# Patient Record
Sex: Male | Born: 1955 | Race: White | Hispanic: No | Marital: Married | Smoking: Never smoker
Health system: Southern US, Community
[De-identification: ages and names within clinical notes are randomized; demographics above are authoritative.]

## PROBLEM LIST (undated history)

## (undated) DIAGNOSIS — M1990 Unspecified osteoarthritis, unspecified site: Secondary | ICD-10-CM

## (undated) DIAGNOSIS — F32A Depression, unspecified: Secondary | ICD-10-CM

## (undated) DIAGNOSIS — G4730 Sleep apnea, unspecified: Secondary | ICD-10-CM

## (undated) DIAGNOSIS — F419 Anxiety disorder, unspecified: Secondary | ICD-10-CM

## (undated) DIAGNOSIS — I10 Essential (primary) hypertension: Secondary | ICD-10-CM

## (undated) DIAGNOSIS — K219 Gastro-esophageal reflux disease without esophagitis: Secondary | ICD-10-CM

## (undated) DIAGNOSIS — E785 Hyperlipidemia, unspecified: Secondary | ICD-10-CM

## (undated) DIAGNOSIS — F329 Major depressive disorder, single episode, unspecified: Secondary | ICD-10-CM

## (undated) DIAGNOSIS — T7840XA Allergy, unspecified, initial encounter: Secondary | ICD-10-CM

## (undated) HISTORY — DX: Hyperlipidemia, unspecified: E78.5

## (undated) HISTORY — PX: ROTATOR CUFF REPAIR: SHX139

## (undated) HISTORY — PX: OTHER SURGICAL HISTORY: SHX169

## (undated) HISTORY — DX: Sleep apnea, unspecified: G47.30

## (undated) HISTORY — DX: Anxiety disorder, unspecified: F41.9

## (undated) HISTORY — DX: Essential (primary) hypertension: I10

## (undated) HISTORY — DX: Allergy, unspecified, initial encounter: T78.40XA

## (undated) HISTORY — DX: Unspecified osteoarthritis, unspecified site: M19.90

## (undated) HISTORY — DX: Gastro-esophageal reflux disease without esophagitis: K21.9

---
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
## 2015-11-15 HISTORY — PX: BICEPS TENDON REPAIR: SHX566

## 2017-09-03 ENCOUNTER — Encounter: Payer: Self-pay | Admitting: Family Medicine

## 2017-09-03 ENCOUNTER — Ambulatory Visit: Payer: 59 | Admitting: Family Medicine

## 2017-09-03 VITALS — BP 124/83 | HR 68 | Temp 98.3°F | Resp 12 | Ht 72.0 in | Wt 168.1 lb

## 2017-09-03 DIAGNOSIS — E785 Hyperlipidemia, unspecified: Secondary | ICD-10-CM | POA: Diagnosis not present

## 2017-09-03 DIAGNOSIS — F419 Anxiety disorder, unspecified: Secondary | ICD-10-CM

## 2017-09-03 DIAGNOSIS — M170 Bilateral primary osteoarthritis of knee: Secondary | ICD-10-CM | POA: Diagnosis not present

## 2017-09-03 DIAGNOSIS — I10 Essential (primary) hypertension: Secondary | ICD-10-CM

## 2017-09-03 DIAGNOSIS — G4700 Insomnia, unspecified: Secondary | ICD-10-CM | POA: Diagnosis not present

## 2017-09-03 LAB — COMPREHENSIVE METABOLIC PANEL
ALBUMIN: 4.9 g/dL (ref 3.5–5.2)
ALK PHOS: 63 U/L (ref 39–117)
ALT: 19 U/L (ref 0–53)
AST: 21 U/L (ref 0–37)
BILIRUBIN TOTAL: 0.8 mg/dL (ref 0.2–1.2)
BUN: 16 mg/dL (ref 6–23)
CALCIUM: 10.5 mg/dL (ref 8.4–10.5)
CO2: 31 mEq/L (ref 19–32)
Chloride: 102 mEq/L (ref 96–112)
Creatinine, Ser: 0.99 mg/dL (ref 0.40–1.50)
GFR: 81.64 mL/min (ref >60.00)
Glucose, Bld: 118 mg/dL — ABNORMAL HIGH (ref 70–99)
POTASSIUM: 3.9 meq/L (ref 3.5–5.1)
Sodium: 142 mEq/L (ref 135–145)
Total Protein: 7.5 g/dL (ref 6.0–8.3)

## 2017-09-03 LAB — LIPID PANEL
CHOLESTEROL: 225 mg/dL — AB (ref 0–200)
HDL: 65.6 mg/dL (ref >39.00)
NonHDL: 159.78
Total CHOL/HDL Ratio: 3
Triglycerides: 212 mg/dL — ABNORMAL HIGH (ref 0.0–149.0)
VLDL: 42.4 mg/dL — AB (ref 0.0–40.0)

## 2017-09-03 LAB — LDL CHOLESTEROL, DIRECT: Direct LDL: 140 mg/dL

## 2017-09-03 MED ORDER — SERTRALINE HCL 25 MG PO TABS: 25.0000 mg | ORAL_TABLET | Freq: Every day | ORAL | 2 refills | Status: DC

## 2017-09-03 NOTE — Progress Notes (Signed)
HPI:   Harold Brown is a 62 y.o. male, who is here today to establish care.  Former PCP: In [place],he just moved to the area. Last preventive routine visit: About a year ago.  Chronic medical problems: HLD,HTN,anxiety,insomnia.  HTN:  Dx around 2008. He ran out of Azor 5-40 mg about 3 months ago. He is taking Metoprolol Succinate 100 mg daily. Denies severe/frequent headache, visual changes, chest pain, dyspnea, palpitation, claudication, focal weakness, or edema.  HLD:  On non pharmacologic treatment. He follows a low fat diet.  Insomnia: He is currently on Ambien 10 mg a few times per week. He tried Melatonin and OTC sleep aids, did not help. Also tried Trazodone but did not help. He sleeps about 7 hours,interrupted.  Anxiety: Dx about 10 years ago.  He is on Alprazolam 0.5 mg bid. States that he was taken a higher dose and has been decreasing dose slowly.  Wellbutrin in the past, which he did not tolerate. He has tried other medications,does not recall names. He denies Hx of depression or suicidal thoughts.  Knee and shoulders pain:  He is also requesting referral to orthopedist, he has history of knee OA and chronic shoulder pain. He usually receives intra-articular knee injections, which helps for about 6 to 8 months. No edema or erythema.  Concerns today: Xanax refill.   Review of Systems  Constitutional: Positive for fatigue. Negative for activity change, appetite change, fever and unexpected weight change.  HENT: Negative for nosebleeds, sore throat and trouble swallowing.   Eyes: Negative for redness and visual disturbance.  Respiratory: Negative for cough, shortness of breath and wheezing.   Cardiovascular: Negative for chest pain, palpitations and leg swelling.  Gastrointestinal: Negative for abdominal pain, nausea and vomiting.  Genitourinary: Negative for decreased urine volume, dysuria and hematuria.  Musculoskeletal: Positive for  arthralgias.  Neurological: Negative for seizures, weakness and headaches.  Psychiatric/Behavioral: Positive for sleep disturbance. The patient is nervous/anxious.       Current Outpatient Medications on File Prior to Visit  Medication Sig Dispense Refill  . Ascorbic Acid (VITAMIN C ADULT GUMMIES PO) Take by mouth 2 (two) times daily.    . metoprolol succinate (TOPROL-XL) 100 MG 24 hr tablet Take 100 mg by mouth daily. Take with or immediately following a meal.    . Multiple Vitamins-Minerals (HM MULTIVITAMIN ADULT GUMMY PO) Take by mouth 2 (two) times daily.     No current facility-administered medications on file prior to visit.      Past Medical History:  Diagnosis Date  . Arthritis   . Hypertension    Allergies  Allergen Reactions  . Erythromycin Rash    Family History  Problem Relation Age of Onset  . Arthritis Mother   . Cancer Mother   . Diabetes Father   . Heart disease Father     Social History   Socioeconomic History  . Marital status: Married    Spouse name: Not on file  . Number of children: Not on file  . Years of education: Not on file  . Highest education level: Not on file  Occupational History  . Not on file  Social Needs  . Financial resource strain: Not on file  . Food insecurity:    Worry: Not on file    Inability: Not on file  . Transportation needs:    Medical: Not on file    Non-medical: Not on file  Tobacco Use  . Smoking status: Never Smoker  .  Smokeless tobacco: Never Used  Substance and Sexual Activity  . Alcohol use: Yes  . Drug use: Never  . Sexual activity: Yes  Lifestyle  . Physical activity:    Days per week: Not on file    Minutes per session: Not on file  . Stress: Not on file  Relationships  . Social connections:    Talks on phone: Not on file    Gets together: Not on file    Attends religious service: Not on file    Active member of club or organization: Not on file    Attends meetings of clubs or  organizations: Not on file    Relationship status: Not on file  Other Topics Concern  . Not on file  Social History Narrative  . Not on file    Vitals:   09/03/17 0803  BP: 124/83  Pulse: 68  Resp: 12  Temp: 98.3 F (36.8 C)  SpO2: 98%    Body mass index is 22.8 kg/m.   Physical Exam  Nursing note reviewed. Constitutional: He is oriented to person, place, and time. He appears well-developed. No distress.  HENT:  Head: Normocephalic and atraumatic.  Mouth/Throat: Oropharynx is clear and moist and mucous membranes are normal.  Eyes: Pupils are equal, round, and reactive to light. Conjunctivae are normal.  Cardiovascular: Normal rate and regular rhythm.  No murmur heard. Pulses:      Dorsalis pedis pulses are 2+ on the right side, and 2+ on the left side.  Respiratory: Effort normal and breath sounds normal. No respiratory distress.  GI: Soft. He exhibits no mass. There is no hepatomegaly. There is no tenderness.  Musculoskeletal: He exhibits no edema.  Lymphadenopathy:    He has no cervical adenopathy.  Neurological: He is alert and oriented to person, place, and time. He has normal strength. No cranial nerve deficit. Gait normal.  Skin: Skin is warm. No rash noted. No erythema.  Psychiatric: His mood appears anxious.  Poor groomed, good eye contact.    ASSESSMENT AND PLAN:  Harold Brown was seen today for establish care.  Diagnoses and all orders for this visit:  Lab Results  Component Value Date   CHOL 225 (H) 09/03/2017   HDL 65.60 09/03/2017   LDLDIRECT 140.0 09/03/2017   TRIG 212.0 (H) 09/03/2017   CHOLHDL 3 09/03/2017   Lab Results  Component Value Date   CREATININE 0.99 09/03/2017   BUN 16 09/03/2017   NA 142 09/03/2017   K 3.9 09/03/2017   CL 102 09/03/2017   CO2 31 09/03/2017   Lab Results  Component Value Date   ALT 19 09/03/2017   AST 21 09/03/2017   ALKPHOS 63 09/03/2017   BILITOT 0.8 09/03/2017    Insomnia, unspecified type  Good  sleep hygiene. He agrees with discontinuing Ambien.  Hypertension, essential, benign  Adequately controlled with just Metoprolol,so no changes for now. Low salt diet to continue. Eye exam recommended annually. F/U in 6 months, before if needed.  -     Comprehensive metabolic panel  Hyperlipidemia, unspecified hyperlipidemia type  Continue non pharmacologic treatment. Further recommendations will be given according to lab results.  The 10-year ASCVD risk score Mikey Bussing DC Jr., et al., 2013) is: 9.5%   Values used to calculate the score:     Age: 89 years     Sex: Male     Is Non-Hispanic African American: No     Diabetic: No     Tobacco smoker: No  Systolic Blood Pressure: 696 mmHg     Is BP treated: Yes     HDL Cholesterol: 65.6 mg/dL     Total Cholesterol: 225 mg/dL  -     Comprehensive metabolic panel -     Lipid panel -     LDL cholesterol, direct  Osteoarthritis of both knees, unspecified osteoarthritis type  He will continue following with ortho.  -     Ambulatory referral to Orthopedic Surgery  Anxiety disorder, unspecified type  After discussion of other pharmacologic treatment,he agrees with trying a SSRI. Sertraline 25 mg daily recommended. Some side effects discussed. No changes in Xanax. F/U in 3 moths.  -     sertraline (ZOLOFT) 25 MG tablet; Take 1 tablet (25 mg total) by mouth daily. -     ALPRAZolam (XANAX) 0.5 MG tablet; Take 1 tablet (0.5 mg total) by mouth 2 (two) times daily as needed for anxiety.      Charyl Minervini G. Martinique, MD  [facility]. [place] office.

## 2017-09-03 NOTE — Patient Instructions (Signed)
A few things to remember from today's visit:   Hypertension, essential, benign - Plan: Comprehensive metabolic panel  Hyperlipidemia, unspecified hyperlipidemia type - Plan: Comprehensive metabolic panel, Lipid panel  Osteoarthritis of both knees, unspecified osteoarthritis type - Plan: Ambulatory referral to Orthopedic Surgery  Anxiety disorder, unspecified type - Plan: sertraline (ZOLOFT) 25 MG tablet  Insomnia, unspecified type  Sertraline 25 mg added today. We will try to decrease Alprazolam later,continue as needed.  No changes in Metoprolol.   Please be sure medication list is accurate. If a new problem present, please set up appointment sooner than planned today.

## 2017-09-04 ENCOUNTER — Telehealth: Payer: Self-pay | Admitting: Family Medicine

## 2017-09-04 NOTE — Telephone Encounter (Addendum)
Copied from [place]. Topic: Quick Communication - See Telephone Encounter >> Sep 04, 2017  3:26 PM Ivar Drape wrote: CRM for notification. See Telephone encounter for: 09/04/17. Patient was in to see the provider yesterday and his ALPRAZolam Duanne Moron) 0.5 MG tablet medication was not called to his preferred pharmacy [facility] [address].

## 2017-09-04 NOTE — Telephone Encounter (Signed)
Refill of Xanax by historical provider  LOV 09/03/17 Dr. Martinique  [facility] 06/20/17  [facility] [address].

## 2017-09-05 MED ORDER — ALPRAZOLAM 0.5 MG PO TABS: 0.5000 mg | ORAL_TABLET | Freq: Two times a day (BID) | ORAL | 0 refills | Status: DC | PRN

## 2017-09-05 NOTE — Telephone Encounter (Signed)
Message sent to Dr. Jordan for approval. 

## 2017-09-05 NOTE — Telephone Encounter (Signed)
Prescription for Xanax 0.5 mg to continue twice daily as needed was sent to his pharmacy. Thanks, BJ

## 2017-09-06 ENCOUNTER — Encounter: Payer: Self-pay | Admitting: Family Medicine

## 2017-09-09 ENCOUNTER — Other Ambulatory Visit: Payer: Self-pay | Admitting: *Deleted

## 2017-09-09 MED ORDER — ATORVASTATIN CALCIUM 20 MG PO TABS: 20.0000 mg | ORAL_TABLET | Freq: Every day | ORAL | 3 refills | Status: DC

## 2017-09-11 ENCOUNTER — Ambulatory Visit (INDEPENDENT_AMBULATORY_CARE_PROVIDER_SITE_OTHER): Payer: 59 | Admitting: Orthopaedic Surgery

## 2017-09-11 ENCOUNTER — Ambulatory Visit (INDEPENDENT_AMBULATORY_CARE_PROVIDER_SITE_OTHER): Payer: 59

## 2017-09-11 DIAGNOSIS — M25562 Pain in left knee: Secondary | ICD-10-CM

## 2017-09-11 DIAGNOSIS — M170 Bilateral primary osteoarthritis of knee: Secondary | ICD-10-CM | POA: Diagnosis not present

## 2017-09-11 DIAGNOSIS — M25561 Pain in right knee: Secondary | ICD-10-CM | POA: Diagnosis not present

## 2017-09-11 MED ORDER — LIDOCAINE HCL 1 % IJ SOLN
2.0000 mL | INTRAMUSCULAR | Status: AC | PRN
  Administered 2017-09-11: 2 mL

## 2017-09-11 MED ORDER — METHYLPREDNISOLONE ACETATE 40 MG/ML IJ SUSP
40.0000 mg | INTRAMUSCULAR | Status: AC | PRN
  Administered 2017-09-11: 40 mg via INTRA_ARTICULAR

## 2017-09-11 MED ORDER — BUPIVACAINE HCL 0.5 % IJ SOLN
2.0000 mL | INTRAMUSCULAR | Status: AC | PRN
  Administered 2017-09-11: 2 mL via INTRA_ARTICULAR

## 2017-09-11 NOTE — Progress Notes (Signed)
Office Visit Note   Patient: Harold Brown           Date of Birth: 1955-10-21           MRN: [id number] Visit Date: 09/11/2017              Requested by: Martinique, Betty G, MD [address] [place], [place] [address] PCP: Martinique, Betty G, MD   Assessment & Plan: Visit Diagnoses:  1. Bilateral primary osteoarthritis of knee     Plan: Impression-year-old gentleman with mild bilateral knee osteoarthritis.  Cortisone injections performed today into both knees.  Patient has my card and instructed to call when he wants [place] to submit preapproval for Visco supplementation injections.  Follow-Up Instructions: Return if symptoms worsen or fail to improve.   Orders:  Orders Placed This Encounter  Procedures  . Large Joint Inj  . XR KNEE 3 VIEW RIGHT  . XR KNEE 3 VIEW LEFT   No orders of the defined types were placed in this encounter.     Procedures: Large Joint Inj: bilateral knee on 09/11/2017 11:13 AM Indications: pain Details: 22 Brown needle  Arthrogram: No  Medications (Right): 2 mL lidocaine 1 %; 2 mL bupivacaine 0.5 %; 40 mg methylPREDNISolone acetate 40 MG/ML Medications (Left): 2 mL lidocaine 1 %; 2 mL bupivacaine 0.5 %; 40 mg methylPREDNISolone acetate 40 MG/ML Outcome: tolerated well, no immediate complications Patient was prepped and draped in the usual sterile fashion.       Clinical Data: No additional findings.   Subjective: Chief Complaint  Patient presents with  . Left Knee - Pain  . Right Knee - Pain    [name] is a very pleasant 62 year old gentleman comes on the pain.  He has been getting regular cortisone and Visco supplementation injections by his orthopedist that he was seen back in [place].  He has recently relocated to [facility].  He is requesting cortisone injections today.  Denies any swelling or any mechanical symptoms or any changes to his symptoms.   Review of Systems  Constitutional: Negative.   All other systems reviewed and  are negative.    Objective: Vital Signs: There were no vitals taken for this visit.  Physical Exam  Constitutional: He is oriented to person, place, and time. He appears well-developed and well-nourished.  HENT:  Head: Normocephalic and atraumatic.  Eyes: Pupils are equal, round, and reactive to light.  Neck: Neck supple.  Pulmonary/Chest: Effort normal.  Abdominal: Soft.  Musculoskeletal: Normal range of motion.  Neurological: He is alert and oriented to person, place, and time.  Skin: Skin is warm.  Psychiatric: He has a normal mood and affect. His behavior is normal. Judgment and thought content normal.  Nursing note and vitals reviewed.   Ortho Exam Bilateral knee exam shows no joint effusion.  Normal range of motion.  Collaterals and cruciates are stable. Specialty Comments:  No specialty comments available.  Imaging: Xr Knee 3 View Left  Result Date: 09/11/2017 Mild osteoarthritis without significant degenerative changes  Xr Knee 3 View Right  Result Date: 09/11/2017 Mild osteoarthritis without significant degenerative changes    PMFS History: There are no active problems to display for this patient.  Past Medical History:  Diagnosis Date  . Arthritis   . Hypertension     Family History  Problem Relation Age of Onset  . Arthritis Mother   . Cancer Mother   . Diabetes Father   . Heart disease Father  Past Surgical History:  Procedure Laterality Date  . BICEPS TENDON REPAIR  11/2015   upper  . ROTATOR CUFF REPAIR  2013, 2017   Social History   Occupational History  . Not on file  Tobacco Use  . Smoking status: Never Smoker  . Smokeless tobacco: Never Used  Substance and Sexual Activity  . Alcohol use: Yes  . Drug use: Never  . Sexual activity: Yes

## 2017-10-22 ENCOUNTER — Other Ambulatory Visit: Payer: Self-pay | Admitting: Family Medicine

## 2017-10-22 DIAGNOSIS — F419 Anxiety disorder, unspecified: Secondary | ICD-10-CM

## 2017-11-03 ENCOUNTER — Ambulatory Visit: Payer: 59 | Admitting: Family Medicine

## 2017-11-03 ENCOUNTER — Encounter: Payer: Self-pay | Admitting: Family Medicine

## 2017-11-03 VITALS — BP 160/90 | HR 67 | Temp 98.2°F | Resp 12 | Ht 72.0 in | Wt 174.0 lb

## 2017-11-03 DIAGNOSIS — L700 Acne vulgaris: Secondary | ICD-10-CM | POA: Insufficient documentation

## 2017-11-03 DIAGNOSIS — Z23 Encounter for immunization: Secondary | ICD-10-CM | POA: Diagnosis not present

## 2017-11-03 DIAGNOSIS — G4700 Insomnia, unspecified: Secondary | ICD-10-CM | POA: Diagnosis not present

## 2017-11-03 DIAGNOSIS — R232 Flushing: Secondary | ICD-10-CM | POA: Diagnosis not present

## 2017-11-03 DIAGNOSIS — R739 Hyperglycemia, unspecified: Secondary | ICD-10-CM

## 2017-11-03 DIAGNOSIS — Z1211 Encounter for screening for malignant neoplasm of colon: Secondary | ICD-10-CM

## 2017-11-03 DIAGNOSIS — F411 Generalized anxiety disorder: Secondary | ICD-10-CM | POA: Insufficient documentation

## 2017-11-03 DIAGNOSIS — I10 Essential (primary) hypertension: Secondary | ICD-10-CM

## 2017-11-03 LAB — POCT GLYCOSYLATED HEMOGLOBIN (HGB A1C): HEMOGLOBIN A1C: 5.2 % (ref 4.0–5.6)

## 2017-11-03 LAB — TSH: TSH: 1.81 u[IU]/mL (ref 0.35–4.50)

## 2017-11-03 MED ORDER — SERTRALINE HCL 50 MG PO TABS: 50.0000 mg | ORAL_TABLET | Freq: Every day | ORAL | 1 refills | Status: DC

## 2017-11-03 MED ORDER — AMLODIPINE-OLMESARTAN 5-40 MG PO TABS: 1.0000 | ORAL_TABLET | Freq: Every day | ORAL | 1 refills | Status: DC

## 2017-11-03 MED ORDER — DOXEPIN HCL 10 MG/ML PO CONC: 3.0000 mg | Freq: Every day | ORAL | 0 refills | Status: DC

## 2017-11-03 NOTE — Assessment & Plan Note (Signed)
No lesions appreciated today. Dermatology referral placed as requested.

## 2017-11-03 NOTE — Assessment & Plan Note (Signed)
Still having difficulty falling asleep. Recommend doxepin, which he can titrate up from 3 mg to 6 mg. Good sleep hygiene. We discussed some side effects of medication. He will let me know if medication helps. Follow-up in 3 to 4 months.

## 2017-11-03 NOTE — Assessment & Plan Note (Signed)
Improved with sertraline 25 mg, he thinks he will do better with 50 mg, so dose increased. We discussed some side effects. Information about psychotherapy also given, which may help to control irritability. Instructed about warning signs. Follow-up in 4 months.

## 2017-11-03 NOTE — Assessment & Plan Note (Addendum)
Not well controlled. Possible complications of elevated BP discussed. Amlodipine-olmesartan 5-40 mg resume today. No changes on metoprolol XL 100 mg daily. Recommend monitoring BP at home. Instructed about warning signs. F/U in 3-4 months.

## 2017-11-03 NOTE — Progress Notes (Signed)
HPI:   [name] is a 62 y.o. male, who is here today to follow on recent OV.   He was last seen on 09/03/2017, when he established care.  Since his last visit he has followed with orthopedist,Dr Erlinda Hong.  Last visit he was complaining about worsening anxiety and insomnia. He was taking Ambien and Xanax, he agreed with stopping Ambien and continue with Xanax 0.5 mg twice daily. He was also started on sertraline 25 mg daily. He has noticed great improvement of symptoms initially but he feels like dose may need to be increased.  He takes Xanax 0.5 mg daily as needed, occasionally he takes a second dose. He denies depressed mood or suicidal thoughts.  C/O intermittent episodes of hot flashes. This has been going on for a few weeks. No tremor,palpitation,or wt loss. He has not identified exacerbating or alleviating factors.   Insomnia: He is still having trouble staying asleep. He is no longer on Ambien.  Fasting glucose was 118, no history of diabetes. Atorvastatin 20 mg was started in 08/2017. Tolerating medication well,no side effects.   Lab Results  Component Value Date   CHOL 225 (H) 09/03/2017   HDL 65.60 09/03/2017   LDLDIRECT 140.0 09/03/2017   TRIG 212.0 (H) 09/03/2017   CHOLHDL 3 09/03/2017   HTN: He is on Metoprolol Succinate 100 mg daily. He was on Azor 5-40 mg,whcih he discontinued a few months ago,BP was well controlled. He is not checking BP at home.  Lab Results  Component Value Date   CREATININE 0.99 09/03/2017   BUN 16 09/03/2017   NA 142 09/03/2017   K 3.9 09/03/2017   CL 102 09/03/2017   CO2 31 09/03/2017    Denies severe/frequent headache, visual changes, chest pain, dyspnea, palpitation, claudication, focal weakness, or edema.  He received a letter from GI in [place], reminding him that he is due for his colonoscopy.  He is also requesting referral to dermatologist. Hx of cystic acne. He is not on treatment.   Review  of Systems  Constitutional: Negative for activity change, appetite change, fatigue and fever.  HENT: Negative for nosebleeds, sore throat and trouble swallowing.   Eyes: Negative for redness and visual disturbance.  Respiratory: Negative for apnea, cough, shortness of breath and wheezing.   Cardiovascular: Negative for chest pain, palpitations and leg swelling.  Gastrointestinal: Negative for abdominal pain, nausea and vomiting.  Endocrine: Positive for heat intolerance. Negative for cold intolerance, polydipsia, polyphagia and polyuria.  Genitourinary: Negative for decreased urine volume, dysuria and hematuria.  Neurological: Negative for seizures, weakness and headaches.  Psychiatric/Behavioral: Positive for sleep disturbance. The patient is nervous/anxious.       Current Outpatient Medications on File Prior to Visit  Medication Sig Dispense Refill  . ALPRAZolam (XANAX) 0.5 MG tablet TAKE 1 TABLET BY MOUTH 2 TIMES DAILY AS NEEDED FOR ANXIETY. 60 tablet 0  . Ascorbic Acid (VITAMIN C ADULT GUMMIES PO) Take by mouth 2 (two) times daily.    Marland Kitchen atorvastatin (LIPITOR) 20 MG tablet Take 1 tablet (20 mg total) by mouth daily. 90 tablet 3  . metoprolol succinate (TOPROL-XL) 100 MG 24 hr tablet Take 100 mg by mouth daily. Take with or immediately following a meal.    . Multiple Vitamins-Minerals (HM MULTIVITAMIN ADULT GUMMY PO) Take by mouth 2 (two) times daily.     No current facility-administered medications on file prior to visit.      Past Medical History:  Diagnosis Date  .  Arthritis   . Hypertension    Allergies  Allergen Reactions  . Erythromycin Rash    Social History   Socioeconomic History  . Marital status: Married    Spouse name: Not on file  . Number of children: Not on file  . Years of education: Not on file  . Highest education level: Not on file  Occupational History  . Not on file  Social Needs  . Financial resource strain: Not on file  . Food insecurity:     Worry: Not on file    Inability: Not on file  . Transportation needs:    Medical: Not on file    Non-medical: Not on file  Tobacco Use  . Smoking status: Never Smoker  . Smokeless tobacco: Never Used  Substance and Sexual Activity  . Alcohol use: Yes  . Drug use: Never  . Sexual activity: Yes  Lifestyle  . Physical activity:    Days per week: Not on file    Minutes per session: Not on file  . Stress: Not on file  Relationships  . Social connections:    Talks on phone: Not on file    Gets together: Not on file    Attends religious service: Not on file    Active member of club or organization: Not on file    Attends meetings of clubs or organizations: Not on file    Relationship status: Not on file  Other Topics Concern  . Not on file  Social History Narrative  . Not on file    Vitals:   11/03/17 0927 11/03/17 1007  BP: (!) 160/98 (!) 160/90  Pulse: 67   Resp: 12   Temp: 98.2 F (36.8 C)   SpO2: 98%    Body mass index is 23.6 kg/m.   Physical Exam  Nursing note and vitals reviewed. Constitutional: He is oriented to person, place, and time. He appears well-developed and well-nourished. No distress.  HENT:  Head: Normocephalic and atraumatic.  Mouth/Throat: Oropharynx is clear and moist and mucous membranes are normal.  Eyes: Pupils are equal, round, and reactive to light. Conjunctivae are normal.  Cardiovascular: Normal rate and regular rhythm.  No murmur heard. Pulses:      Dorsalis pedis pulses are 2+ on the right side, and 2+ on the left side.  Respiratory: Effort normal and breath sounds normal. No respiratory distress.  GI: Soft. He exhibits no mass. There is no hepatomegaly. There is no tenderness.  Musculoskeletal: He exhibits no edema.  Lymphadenopathy:    He has no cervical adenopathy.  Neurological: He is alert and oriented to person, place, and time. He has normal strength. No cranial nerve deficit. Gait normal.  Skin: Skin is warm. No rash noted.  No erythema.  Psychiatric: His mood appears anxious. Cognition and memory are normal.  Fairly groomed, good eye contact.    ASSESSMENT AND PLAN:  [name] was seen today for follow-up.   Orders Placed This Encounter  Procedures  . Flu Vaccine QUAD 6+ mos PF IM (Fluarix Quad PF)  . TSH  . Ambulatory referral to Dermatology  . Ambulatory referral to Gastroenterology  . POCT glycosylated hemoglobin (Hb A1C)   Lab Results  Component Value Date   HGBA1C 5.2 11/03/2017   Lab Results  Component Value Date   TSH 1.81 11/03/2017    Hyperglycemia Healthy lifestyle for primary prevention discussed and recommended.  -     POCT glycosylated hemoglobin (Hb A1C)  GAD (generalized anxiety disorder)  Improved with sertraline 25 mg, he thinks he will do better with 50 mg, so dose increased. We discussed some side effects. Information about psychotherapy also given, which may help to control irritability. Instructed about warning signs. Follow-up in 4 months.  Insomnia Still having difficulty falling asleep. Recommend doxepin, which he can titrate up from 3 mg to 6 mg. Good sleep hygiene. We discussed some side effects of medication. He will let me know if medication helps. Follow-up in 3 to 4 months.  Cystic acne vulgaris No lesions appreciated today. Dermatology referral placed as requested.  Hypertension, essential, benign Not well controlled. Possible complications of elevated BP discussed. Amlodipine-olmesartan 5-40 mg resume today. No changes on metoprolol XL 100 mg daily. Recommend monitoring BP at home. Instructed about warning signs. F/U in 3-4 months.   Colon cancer screening -     Ambulatory referral to Gastroenterology  Hot flashes Further recommendations will be given according to TSH results.  -     TSH  Needs flu shot -     Flu Vaccine QUAD 6+ mos PF IM (Fluarix Quad PF)    Return in about 4 months (around 03/06/2018).   Madylyn Insco G. Martinique,  MD  [facility]. [place] office.

## 2017-11-03 NOTE — Patient Instructions (Addendum)
A few things to remember from today's visit:   Hyperglycemia - Plan: POCT glycosylated hemoglobin (Hb A1C)  Colon cancer screening - Plan: Ambulatory referral to Gastroenterology  Cystic acne vulgaris - Plan: Ambulatory referral to Dermatology  Hot flashes - Plan: TSH  Insomnia, unspecified type - Plan: doxepin (SINEQUAN) 10 MG/ML solution  GAD (generalized anxiety disorder) - Plan: sertraline (ZOLOFT) 50 MG tablet  Today doxepin was added to help her sleep, start with 0.3 mL 30 minutes before bed, you can increase it every 3 to 4 days up to 10 mg as tolerated. Zoloft was increased from 25mg  to 50 mg daily. I will see her back in 3 to 4 months, at that time we will check cholesterol.  Please be sure medication list is accurate. If a new problem present, please set up appointment sooner than planned today.

## 2017-11-06 ENCOUNTER — Encounter: Payer: Self-pay | Admitting: Family Medicine

## 2017-12-05 ENCOUNTER — Ambulatory Visit: Payer: 59 | Admitting: Family Medicine

## 2017-12-05 ENCOUNTER — Encounter: Payer: Self-pay | Admitting: Family Medicine

## 2017-12-05 VITALS — BP 130/84 | HR 88 | Temp 97.9°F | Resp 12 | Ht 72.0 in | Wt 174.2 lb

## 2017-12-05 DIAGNOSIS — K625 Hemorrhage of anus and rectum: Secondary | ICD-10-CM | POA: Diagnosis not present

## 2017-12-05 DIAGNOSIS — D171 Benign lipomatous neoplasm of skin and subcutaneous tissue of trunk: Secondary | ICD-10-CM | POA: Diagnosis not present

## 2017-12-05 DIAGNOSIS — G4700 Insomnia, unspecified: Secondary | ICD-10-CM | POA: Diagnosis not present

## 2017-12-05 DIAGNOSIS — I10 Essential (primary) hypertension: Secondary | ICD-10-CM

## 2017-12-05 DIAGNOSIS — Z23 Encounter for immunization: Secondary | ICD-10-CM

## 2017-12-05 MED ORDER — DOXEPIN HCL 10 MG PO CAPS: 10.0000 mg | ORAL_CAPSULE | Freq: Every evening | ORAL | 1 refills | Status: DC | PRN

## 2017-12-05 NOTE — Progress Notes (Signed)
ACUTE VISIT   HPI:  Chief Complaint  Patient presents with  . Mass on left shoulder  . Stomach issues    had blood in stool last week, has stopped    [name] is a 62 y.o. male, who is here today complaining of mass his wife noted last week, he is not sure for how long this is going on. Thre is no tenderness. It has not changed. There is not tenderness or skin changes.    Also c/o lower abdominal achy like pain. This has been going up for a while.  Feels queasy" in upper abdomen. Pain has been 10/10,today 1/10. It is not radiated. It is intermittent,worse in the morning. Alleviated by food intake.  He has not identified exacerbating factors but stopping orange juice (09/2017) seemed to help.   Last week he noted blood in stool for a few days after defecation. He noted blood in toilet but not mixed with stool. No dyschezia.  No changes in bowel habits.  Denies urinary symptoms.  He is due for colonoscopy.  Insomnia: He would like to change Doxepin from liquid to tabs. He started taking Doxepin 3 mg and titrate to 10 mg. Medication is helping,sleeping about 7-8 hours. Denies side effects.  HTN: Last OV his BP was elevated. He started Azor 5-40 mg daily. He is also on Metoprolol Succinate 100 mg daily. He is not checking BP at home.  Lab Results  Component Value Date   CREATININE 0.99 09/03/2017   BUN 16 09/03/2017   NA 142 09/03/2017   K 3.9 09/03/2017   CL 102 09/03/2017   CO2 31 09/03/2017   Denies severe/frequent headache, visual changes, chest pain, dyspnea, palpitation, focal weakness, or edema.   Review of Systems  Constitutional: Negative for activity change, appetite change, fatigue and fever.  HENT: Positive for postnasal drip and rhinorrhea. Negative for nosebleeds, sore throat and trouble swallowing.   Eyes: Negative for redness and visual disturbance.  Respiratory: Negative for cough, shortness of breath and wheezing.     Cardiovascular: Negative for chest pain, palpitations and leg swelling.  Gastrointestinal: Positive for anal bleeding. Negative for abdominal pain, nausea and vomiting.  Genitourinary: Negative for decreased urine volume, dysuria and hematuria.  Neurological: Negative for weakness and headaches.  Hematological: Negative for adenopathy. Does not bruise/bleed easily.  Psychiatric/Behavioral: Positive for sleep disturbance. Negative for confusion. The patient is nervous/anxious.       Current Outpatient Medications on File Prior to Visit  Medication Sig Dispense Refill  . ALPRAZolam (XANAX) 0.5 MG tablet TAKE 1 TABLET BY MOUTH 2 TIMES DAILY AS NEEDED FOR ANXIETY. 60 tablet 0  . amLODipine-olmesartan (AZOR) 5-40 MG tablet Take 1 tablet by mouth daily. 90 tablet 1  . Ascorbic Acid (VITAMIN C ADULT GUMMIES PO) Take by mouth 2 (two) times daily.    Marland Kitchen atorvastatin (LIPITOR) 20 MG tablet Take 1 tablet (20 mg total) by mouth daily. 90 tablet 3  . metoprolol succinate (TOPROL-XL) 100 MG 24 hr tablet Take 100 mg by mouth daily. Take with or immediately following a meal.    . Multiple Vitamins-Minerals (HM MULTIVITAMIN ADULT GUMMY PO) Take by mouth 2 (two) times daily.    . sertraline (ZOLOFT) 50 MG tablet Take 1 tablet (50 mg total) by mouth daily. 90 tablet 1   No current facility-administered medications on file prior to visit.      Past Medical History:  Diagnosis Date  . Arthritis   .  Hypertension    Allergies  Allergen Reactions  . Erythromycin Rash    Social History   Socioeconomic History  . Marital status: Married    Spouse name: Not on file  . Number of children: Not on file  . Years of education: Not on file  . Highest education level: Not on file  Occupational History  . Not on file  Social Needs  . Financial resource strain: Not on file  . Food insecurity:    Worry: Not on file    Inability: Not on file  . Transportation needs:    Medical: Not on file     Non-medical: Not on file  Tobacco Use  . Smoking status: Never Smoker  . Smokeless tobacco: Never Used  Substance and Sexual Activity  . Alcohol use: Yes  . Drug use: Never  . Sexual activity: Yes  Lifestyle  . Physical activity:    Days per week: Not on file    Minutes per session: Not on file  . Stress: Not on file  Relationships  . Social connections:    Talks on phone: Not on file    Gets together: Not on file    Attends religious service: Not on file    Active member of club or organization: Not on file    Attends meetings of clubs or organizations: Not on file    Relationship status: Not on file  Other Topics Concern  . Not on file  Social History Narrative  . Not on file    Vitals:   12/05/17 1442  BP: 130/84  Pulse: 88  Resp: 12  Temp: 97.9 F (36.6 C)  SpO2: 96%   Body mass index is 23.63 kg/m.   Physical Exam  Nursing note and vitals reviewed. Constitutional: He is oriented to person, place, and time. He appears well-developed and well-nourished. No distress.  HENT:  Head: Normocephalic and atraumatic.  Mouth/Throat: Oropharynx is clear and moist and mucous membranes are normal.  Eyes: Conjunctivae are normal.  Cardiovascular: Normal rate and regular rhythm.  No murmur heard. Respiratory: Effort normal and breath sounds normal. No respiratory distress.  GI: Soft. Bowel sounds are normal. He exhibits no mass. There is no hepatomegaly. There is no tenderness.  Musculoskeletal: He exhibits no edema.       Arms: Lymphadenopathy:    He has no cervical adenopathy.  Neurological: He is alert and oriented to person, place, and time. He has normal strength. No cranial nerve deficit. Gait normal.  Skin: Skin is warm. Lesion noted. No rash noted. No erythema.  On lower aspect of left scapula nodular lesion 3-4 cm,mobile, no tenderness.   Psychiatric: His mood appears anxious.  Well groomed, good eye contact.    ASSESSMENT AND PLAN:  Harold Brown was seen  today for mass on left shoulder and stomach issues.  Diagnoses and all orders for this visit:  Rectal bleeding Guaiac negative today. Possible etiologies discussed, ? Internal hemorrhoids. Instructed about warning signs.  -     Ambulatory referral to Gastroenterology  Insomnia, unspecified type Better controlled with Doxepin 10 mg,no changes in current management. Good sleep hygiene also recommended.  -     doxepin (SINEQUAN) 10 MG capsule; Take 1 capsule (10 mg total) by mouth at bedtime as needed.  Hypertension, essential, benign Adequately controlled. No changes in current management. Continue low salt diet. Eye exam recommended annually.  Lipoma of back Reassured. Genderal surgery referral placed.  -     Ambulatory referral  to General Surgery  Need for Tdap vaccination -     Tdap vaccine greater than or equal to 7yo IM      Return if symptoms worsen or fail to improve, for Keep next f/u appt.      Charlesetta Milliron G. Martinique, MD  [facility]. [place] office.

## 2017-12-05 NOTE — Patient Instructions (Addendum)
A few things to remember from today's visit:   Insomnia, unspecified type - Plan: doxepin (SINEQUAN) 10 MG capsule  Rectal bleeding - Plan: Ambulatory referral to Gastroenterology  Hypertension, essential, benign  Lipoma of back - Plan: Ambulatory referral to General Surgery   Rectal Bleeding Rectal bleeding is when blood comes out of the opening of the butt (anus). People with this kind of bleeding may notice bright red blood in their underwear or in the toilet after they poop (have a bowel movement). They may also have dark red or black poop (stool). Rectal bleeding is often a sign that something is wrong. It needs to be checked by a doctor. Follow these instructions at home: Watch for any changes in your condition. Take these actions to help with bleeding and discomfort:  Eat a diet that is high in fiber. This will keep your poop soft so it is easier for you to poop without pushing too hard. Ask your doctor to tell you what foods and drinks are high in fiber.  Drink enough fluid to keep your pee (urine) clear or pale yellow. This also helps keep your poop soft.  Try taking a warm bath. This may help with pain.  Keep all follow-up visits as told by your doctor. This is important.  Get help right away if:  You have new bleeding.  You have more bleeding than before.  You have black or dark red poop.  You throw up (vomit) blood or something that looks like coffee grounds.  You have pain or tenderness in your belly (abdomen).  You have a fever.  You feel weak.  You feel sick to your stomach (nauseous).  You pass out (faint).  You have very bad pain in your butt.  You cannot poop. This information is not intended to replace advice given to you by your health care provider. Make sure you discuss any questions you have with your health care provider. Document Released: 09/12/2010 Document Revised: 06/08/2015 Document Reviewed: 02/26/2015 Elsevier Interactive Patient  Education  [employer].  Please be sure medication list is accurate. If a new problem present, please set up appointment sooner than planned today.

## 2017-12-07 ENCOUNTER — Encounter: Payer: Self-pay | Admitting: Family Medicine

## 2017-12-15 ENCOUNTER — Other Ambulatory Visit: Payer: Self-pay | Admitting: Family Medicine

## 2017-12-15 DIAGNOSIS — F419 Anxiety disorder, unspecified: Secondary | ICD-10-CM

## 2017-12-19 ENCOUNTER — Encounter: Payer: Self-pay | Admitting: Family Medicine

## 2017-12-19 ENCOUNTER — Encounter: Payer: Self-pay | Admitting: Gastroenterology

## 2017-12-19 ENCOUNTER — Telehealth: Payer: Self-pay | Admitting: *Deleted

## 2017-12-19 NOTE — Telephone Encounter (Signed)
Alternative requested for 0.5 mg tablet requested for Xanax.  0.5 mg and 0.25 mg tablets on backorder

## 2017-12-24 ENCOUNTER — Other Ambulatory Visit: Payer: Self-pay | Admitting: Family Medicine

## 2017-12-24 ENCOUNTER — Telehealth: Payer: Self-pay | Admitting: Family Medicine

## 2017-12-24 DIAGNOSIS — F419 Anxiety disorder, unspecified: Secondary | ICD-10-CM

## 2017-12-24 MED ORDER — ALPRAZOLAM 1 MG PO TABS: 0.5000 mg | ORAL_TABLET | Freq: Two times a day (BID) | ORAL | 0 refills | Status: DC | PRN

## 2017-12-24 NOTE — Telephone Encounter (Signed)
Copied from [place] [id number]. Topic: Quick Communication - See Telephone Encounter >> Dec 24, 2017  1:18 PM Harold Brown, NT wrote: CRM for notification. See Telephone encounter for: 12/24/17. Patient called and said the pharmacy  is out of  ALPRAZolam Duanne Moron) 0.5 MG tablet  and they need a different dose age ,they suggest  a prescription for the 1.0 until they are in stock again , please advise . [id number] [address], [place] [phone] (Phone) [phone] (Fax)

## 2017-12-24 NOTE — Telephone Encounter (Signed)
Rx for Alprazolam 1 mg to take 1/2 tab bid as needed was sent to his pharmacy. Thanks, BJ

## 2017-12-26 NOTE — Telephone Encounter (Signed)
Rx sent to pharmacy on 12/24/17 by Dr. Martinique.

## 2018-01-05 ENCOUNTER — Other Ambulatory Visit: Payer: Self-pay | Admitting: Family Medicine

## 2018-01-06 NOTE — Telephone Encounter (Signed)
[  A prescription for Xanax 1 mg (Xanax 0.5 mg was not available at that time) was sent to her pharmacy on 12/24/2017 #20 tablets to continue taking 0.5 tablet twice daily, so prescription should last 20 days.] At this time he should still have prescription left if he is taking it as instructed. Is Xanax 0.5 mg now available?  Thanks, BJ

## 2018-01-17 ENCOUNTER — Other Ambulatory Visit: Payer: Self-pay | Admitting: Family Medicine

## 2018-01-17 MED ORDER — ALPRAZOLAM 0.5 MG PO TABS: ORAL_TABLET | ORAL | 2 refills | Status: DC

## 2018-01-17 NOTE — Telephone Encounter (Signed)
Rx for Xanax re-sent. Betty Martinique, MD

## 2018-01-21 ENCOUNTER — Encounter: Payer: Self-pay | Admitting: Gastroenterology

## 2018-01-21 ENCOUNTER — Ambulatory Visit: Payer: BLUE CROSS/BLUE SHIELD | Admitting: Gastroenterology

## 2018-01-21 ENCOUNTER — Other Ambulatory Visit (INDEPENDENT_AMBULATORY_CARE_PROVIDER_SITE_OTHER): Payer: BLUE CROSS/BLUE SHIELD

## 2018-01-21 VITALS — BP 122/80 | HR 68 | Ht 69.5 in | Wt 176.0 lb

## 2018-01-21 DIAGNOSIS — R1033 Periumbilical pain: Secondary | ICD-10-CM

## 2018-01-21 DIAGNOSIS — Z86010 Personal history of colonic polyps: Secondary | ICD-10-CM

## 2018-01-21 LAB — LIPASE: Lipase: 8 U/L — ABNORMAL LOW (ref 11.0–59.0)

## 2018-01-21 LAB — HEPATIC FUNCTION PANEL
ALBUMIN: 4.7 g/dL (ref 3.5–5.2)
ALT: 43 U/L (ref 0–53)
AST: 32 U/L (ref 0–37)
Alkaline Phosphatase: 66 U/L (ref 39–117)
Bilirubin, Direct: 0.1 mg/dL (ref 0.0–0.3)
Total Bilirubin: 0.5 mg/dL (ref 0.2–1.2)
Total Protein: 7.3 g/dL (ref 6.0–8.3)

## 2018-01-21 LAB — CBC
HCT: 41.4 % (ref 39.0–52.0)
Hemoglobin: 14 g/dL (ref 13.0–17.0)
MCHC: 33.8 g/dL (ref 30.0–36.0)
MCV: 93.2 fl (ref 78.0–100.0)
Platelets: 269 10*3/uL (ref 150.0–400.0)
RBC: 4.45 Mil/uL (ref 4.22–5.81)
RDW: 14 % (ref 11.5–15.5)
WBC: 7.8 10*3/uL (ref 4.0–10.5)

## 2018-01-21 LAB — CORTISOL: Cortisol, Plasma: 7 ug/dL

## 2018-01-21 LAB — PROTIME-INR
INR: 1.1 ratio — ABNORMAL HIGH (ref 0.8–1.0)
Prothrombin Time: 12.6 s (ref 9.6–13.1)

## 2018-01-21 MED ORDER — PEG 3350-KCL-NA BICARB-NACL 420 G PO SOLR: 4000.0000 mL | ORAL | 0 refills | Status: DC

## 2018-01-21 NOTE — Progress Notes (Signed)
[place] VISIT   Primary Care Provider Martinique, Betty G, MD [facility] [place] [address] [phone]  Referring Provider Martinique, Betty G, MD [address] [place], [place] [address] [phone]  Patient Profile: Harold Brown is a 63 y.o. male with a pmh significant for HTN, HLD, OSA, OA, Anxiety, Colon Polyps (unknown histology).  The patient presents to the [facility] for an evaluation and management of problem(s) noted below:  Problem List 1. Periumbilical pain   2. History of colonic polyps     History of Present Illness: This is the patient's first visit to the outpatient [place] clinic.  He presents with acute onset of abdominal pain that has become more chronic at this time over the course of the last 5-6 months.  The discomfort can usually be worse if he has not eaten anything.  If he eats a meal there is actually a slight improvement at times with his discomfort.  .  The discomfort is periumbilical/subumbilical at times.  It is intermittent in nature but when it comes it can last for minutes to hours at a time.  He has constipation but this is longer standing and passes a bowel movement every other day.  His stool is hard.  He has noted blood on his toilet paper previously.  His last colonoscopy per his report suggested external hemorrhoids and he thinks his bleeding from the rectum is a result of that.  He states that he has a history of colon polyps and was told to have a 5-year follow up colonoscopy and he is overdue for this.  The patient does not take any significant NSAIDs.  He has never had an upper endoscopy.  GI Review of Systems Positive as above Negative for pyrosis, dysphagia, odynophagia, melena, nausea, vomiting, hematemesis  Review of Systems General: Denies fevers/chills/weight loss HEENT: Denies oral lesions Cardiovascular: Denies chest pain Pulmonary: Denies shortness of  breath Gastroenterological: See HPI Genitourinary: Denies darkened urine Hematological: Denies easy bruising/bleeding Endocrine: Denies temperature intolerance Dermatological: Denies jaundice Psychological: Mood is stable   Medications Current Outpatient Medications  Medication Sig Dispense Refill  . ALPRAZolam (XANAX) 0.5 MG tablet TAKE 1 TABLET BY MOUTH TWICE A DAY AS NEEDED FOR ANXIETY,50 tabs per month. 50 tablet 2  . amLODipine-olmesartan (AZOR) 5-40 MG tablet Take 1 tablet by mouth daily. 90 tablet 1  . Ascorbic Acid (VITAMIN C ADULT GUMMIES PO) Take by mouth 2 (two) times daily.    Marland Kitchen atorvastatin (LIPITOR) 20 MG tablet Take 1 tablet (20 mg total) by mouth daily. 90 tablet 3  . doxepin (SINEQUAN) 10 MG capsule Take 1 capsule (10 mg total) by mouth at bedtime as needed. 90 capsule 1  . metoprolol succinate (TOPROL-XL) 100 MG 24 hr tablet Take 1 tablet (100 mg total) by mouth daily. Take with or immediately following a meal. 90 tablet 2  . Multiple Vitamins-Minerals (HM MULTIVITAMIN ADULT GUMMY PO) Take by mouth 2 (two) times daily.    . polyethylene glycol-electrolytes (NULYTELY/GOLYTELY) 420 g solution Take 4,000 mLs by mouth as directed. 4000 mL 0  . sertraline (ZOLOFT) 50 MG tablet Take 1 tablet (50 mg total) by mouth daily. 90 tablet 1   No current facility-administered medications for this visit.     Allergies Allergies  Allergen Reactions  . Erythromycin Rash    Histories Past Medical History:  Diagnosis Date  . Anxiety   . Arthritis   . HLD (hyperlipidemia)   . Hypertension   .  Sleep apnea with use of continuous positive airway pressure (CPAP)    Past Surgical History:  Procedure Laterality Date  . BICEPS TENDON REPAIR Right 11/2015   upper  . ROTATOR CUFF REPAIR Right 2013, 2017   Social History   Socioeconomic History  . Marital status: Married    Spouse name: Not on file  . Number of children: 0  . Years of education: Not on file  . Highest  education level: Not on file  Occupational History  . Not on file  Social Needs  . Financial resource strain: Not on file  . Food insecurity:    Worry: Not on file    Inability: Not on file  . Transportation needs:    Medical: Not on file    Non-medical: Not on file  Tobacco Use  . Smoking status: Never Smoker  . Smokeless tobacco: Never Used  Substance and Sexual Activity  . Alcohol use: Yes    Comment: 2 per day  . Drug use: Never  . Sexual activity: Yes  Lifestyle  . Physical activity:    Days per week: Not on file    Minutes per session: Not on file  . Stress: Not on file  Relationships  . Social connections:    Talks on phone: Not on file    Gets together: Not on file    Attends religious service: Not on file    Active member of club or organization: Not on file    Attends meetings of clubs or organizations: Not on file    Relationship status: Not on file  . Intimate partner violence:    Fear of current or ex partner: Not on file    Emotionally abused: Not on file    Physically abused: Not on file    Forced sexual activity: Not on file  Other Topics Concern  . Not on file  Social History Narrative  . Not on file   Family History  Problem Relation Age of Onset  . Arthritis Mother   . Breast cancer Mother   . Diabetes Father   . Heart disease Father   . Colon cancer Neg Hx   . Esophageal cancer Neg Hx   . Inflammatory bowel disease Neg Hx   . Liver disease Neg Hx   . Pancreatic cancer Neg Hx   . Rectal cancer Neg Hx   . Stomach cancer Neg Hx    I have reviewed his medical, social, and family history in detail and updated the electronic medical record as necessary.    PHYSICAL EXAMINATION  BP 122/80 (BP Location: Left Arm, Patient Position: Sitting, Cuff Size: Normal)   Pulse 68   Ht 5' 9.5" (1.765 m) Comment: height measured without shoes  Wt 176 lb (79.8 kg)   BMI 25.62 kg/m  Wt Readings from Last 3 Encounters:  01/21/18 176 lb (79.8 kg)   12/05/17 174 lb 4 oz (79 kg)  11/03/17 174 lb (78.9 kg)  GEN: NAD, appears stated age, doesn't appear chronically ill PSYCH: Cooperative, without pressured speech EYE: Conjunctivae pink, sclerae anicteric ENT: MMM, without oral ulcers, no erythema or exudates noted NECK: Supple CV: RR without R/Gs  RESP: CTAB posteriorly, without wheezing GI: NABS, soft, TTP in periumbilical region, ND, without rebound or guarding, no HSM appreciated GU: DRE deferred MSK/EXT: No LE edema SKIN: No jaundice NEURO:  Alert & Oriented x 3, no focal deficits   REVIEW OF DATA  I reviewed the following data at the time  of this encounter:  GI Procedures and Studies  No relevant studies to review  Laboratory Studies  Reviewed in epic  Imaging Studies  No relevant studies to review   ASSESSMENT  Harold Brown is a 63 y.o. male with a pmh significant for HTN, HLD, OSA, OA, Anxiety, Colon Polyps (unknown histology).  The patient is seen today for evaluation and management of:  1. Periumbilical pain   2. History of colonic polyps    The patient is clinically stable.  With that being said he does have some abdominal discomfort which is not typical for H. pylori dyspepsia however I think it is worthwhile to rule that out.  We will plan to obtain laboratories as outlined below to rule out other etiologies for his potential abdominal discomfort.  We will try and optimize his stool output as well in regards to fiber supplementation as necessary.  We will plan a colonoscopy but possibly also an upper endoscopy based on his findings.   PLAN  Laboratories as outlined below If H. pylori stool antigen is negative then will be placed on high-dose PPI therapy for total of 6 to 8 weeks The patient has persistent symptoms after a period of high-dose PPI therapy (in the setting of not having H. pylori) and will require an upper endoscopy Plan for colonoscopy later this year in the setting of need for follow-up after  previous colonoscopy dictating a 5-year follow-up Will attempt to try and get records from patient to review prior colonoscopy   Orders Placed This Encounter  Procedures  . Helicobacter pylori special antigen  . CBC  . Lipase  . Hepatic function panel  . Cortisol  . Protime-INR  . Ambulatory referral to Gastroenterology    New Prescriptions   POLYETHYLENE GLYCOL-ELECTROLYTES (NULYTELY/GOLYTELY) 420 G SOLUTION    Take 4,000 mLs by mouth as directed.   Modified Medications   Modified Medication Previous Medication   METOPROLOL SUCCINATE (TOPROL-XL) 100 MG 24 HR TABLET metoprolol succinate (TOPROL-XL) 100 MG 24 hr tablet      Take 1 tablet (100 mg total) by mouth daily. Take with or immediately following a meal.    Take 100 mg by mouth daily. Take with or immediately following a meal.    Planned Follow Up: No follow-ups on file.   Justice Britain, MD [facility] Office # [phone]

## 2018-01-21 NOTE — Patient Instructions (Signed)
You have been scheduled for a colonoscopy/EGD. Please follow written instructions given to you at your visit today.  Please pick up your prep supplies at the pharmacy within the next 1-3 days. If you use inhalers (even only as needed), please bring them with you on the day of your procedure. Your physician has requested that you go to www.startemmi.com and enter the access code given to you at your visit today. This web site gives a general overview about your procedure. However, you should still follow specific instructions given to you by our office regarding your preparation for the procedure.  Your provider has requested that you go to the basement level for lab work before leaving today. Press "B" on the elevator. The lab is located at the first door on the left as you exit the elevator.  Thank you for entrusting me with your care and choosing [facility] care.  Dr Rush Landmark

## 2018-01-22 ENCOUNTER — Encounter: Payer: Self-pay | Admitting: Gastroenterology

## 2018-01-27 ENCOUNTER — Telehealth: Payer: Self-pay | Admitting: Family Medicine

## 2018-01-27 ENCOUNTER — Other Ambulatory Visit: Payer: Self-pay | Admitting: *Deleted

## 2018-01-27 DIAGNOSIS — Z86010 Personal history of colonic polyps: Secondary | ICD-10-CM | POA: Insufficient documentation

## 2018-01-27 DIAGNOSIS — R1033 Periumbilical pain: Secondary | ICD-10-CM | POA: Insufficient documentation

## 2018-01-27 MED ORDER — METOPROLOL SUCCINATE ER 100 MG PO TB24: 100.0000 mg | ORAL_TABLET | Freq: Every day | ORAL | 2 refills | Status: DC

## 2018-01-27 NOTE — Telephone Encounter (Signed)
Copied from [place] [id number]. Topic: Quick Communication - Rx Refill/Question >> Jan 27, 2018  2:02 PM Margot Ables wrote: Medication: metoprolol succinate (TOPROL-XL) 100 MG 24 hr tablet - ran out 2 days ago - takes 1/day - 90 day supply  Has the patient contacted their pharmacy? No - has not had filled by Dr. Martinique yet Preferred Pharmacy (with phone number or street name): [id number] [place], [place] - [address] [phone] (Phone) [phone] (Fax)

## 2018-01-27 NOTE — Telephone Encounter (Signed)
Rx sent to pharmacy as requested.

## 2018-01-29 ENCOUNTER — Other Ambulatory Visit: Payer: BLUE CROSS/BLUE SHIELD

## 2018-01-29 DIAGNOSIS — L700 Acne vulgaris: Secondary | ICD-10-CM | POA: Diagnosis not present

## 2018-01-29 DIAGNOSIS — R1033 Periumbilical pain: Secondary | ICD-10-CM | POA: Diagnosis not present

## 2018-01-29 DIAGNOSIS — Z86010 Personal history of colonic polyps: Secondary | ICD-10-CM | POA: Diagnosis not present

## 2018-01-30 LAB — HELICOBACTER PYLORI  SPECIAL ANTIGEN
MICRO NUMBER:: 65299
SPECIMEN QUALITY: ADEQUATE

## 2018-02-10 ENCOUNTER — Encounter: Payer: Self-pay | Admitting: Gastroenterology

## 2018-02-10 ENCOUNTER — Ambulatory Visit (AMBULATORY_SURGERY_CENTER): Payer: BLUE CROSS/BLUE SHIELD | Admitting: Gastroenterology

## 2018-02-10 VITALS — BP 130/90 | HR 70 | Temp 98.4°F | Resp 16 | Ht 69.0 in | Wt 176.0 lb

## 2018-02-10 DIAGNOSIS — K625 Hemorrhage of anus and rectum: Secondary | ICD-10-CM | POA: Diagnosis not present

## 2018-02-10 DIAGNOSIS — K219 Gastro-esophageal reflux disease without esophagitis: Secondary | ICD-10-CM | POA: Diagnosis not present

## 2018-02-10 DIAGNOSIS — D123 Benign neoplasm of transverse colon: Secondary | ICD-10-CM | POA: Diagnosis not present

## 2018-02-10 DIAGNOSIS — R1033 Periumbilical pain: Secondary | ICD-10-CM

## 2018-02-10 DIAGNOSIS — D124 Benign neoplasm of descending colon: Secondary | ICD-10-CM

## 2018-02-10 DIAGNOSIS — Z86010 Personal history of colonic polyps: Secondary | ICD-10-CM | POA: Diagnosis not present

## 2018-02-10 DIAGNOSIS — K228 Other specified diseases of esophagus: Secondary | ICD-10-CM | POA: Diagnosis not present

## 2018-02-10 MED ORDER — SODIUM CHLORIDE 0.9 % IV SOLN: 500.0000 mL | Freq: Once | INTRAVENOUS | Status: DC

## 2018-02-10 MED ORDER — OMEPRAZOLE 40 MG PO CPDR: 40.0000 mg | DELAYED_RELEASE_CAPSULE | Freq: Every day | ORAL | 2 refills | Status: DC

## 2018-02-10 NOTE — Patient Instructions (Signed)
YOU HAD AN ENDOSCOPIC PROCEDURE TODAY AT [facility]:   Refer to the procedure report that was given to you for any specific questions about what was found during the examination.  If the procedure report does not answer your questions, please call your gastroenterologist to clarify.  If you requested that your care partner not be given the details of your procedure findings, then the procedure report has been included in a sealed envelope for you to review at your convenience later.  YOU SHOULD EXPECT: Some feelings of bloating in the abdomen. Passage of more gas than usual.  Walking can help get rid of the air that was put into your GI tract during the procedure and reduce the bloating. If you had a lower endoscopy (such as a colonoscopy or flexible sigmoidoscopy) you may notice spotting of blood in your stool or on the toilet paper. If you underwent a bowel prep for your procedure, you may not have a normal bowel movement for a few days.  Please Note:  You might notice some irritation and congestion in your nose or some drainage.  This is from the oxygen used during your procedure.  There is no need for concern and it should clear up in a day or so.  SYMPTOMS TO REPORT IMMEDIATELY:   Following lower endoscopy (colonoscopy or flexible sigmoidoscopy):  Excessive amounts of blood in the stool  Significant tenderness or worsening of abdominal pains  Swelling of the abdomen that is new, acute  Fever of 100F or higher   Following upper endoscopy (EGD)  Vomiting of blood or coffee ground material  New chest pain or pain under the shoulder blades  Painful or persistently difficult swallowing  New shortness of breath  Fever of 100F or higher  Black, tarry-looking stools  For urgent or emergent issues, a gastroenterologist can be reached at any hour by calling [phone].   DIET:  We do recommend a small meal at first, but then you may proceed to your regular diet.  Drink  plenty of fluids but you should avoid alcoholic beverages for 24 hours.  ACTIVITY:  You should plan to take it easy for the rest of today and you should NOT DRIVE or use heavy machinery until tomorrow (because of the sedation medicines used during the test).    FOLLOW UP: Our staff will call the number listed on your records the next business day following your procedure to check on you and address any questions or concerns that you may have regarding the information given to you following your procedure. If we do not reach you, we will leave a message.  However, if you are feeling well and you are not experiencing any problems, there is no need to return our call.  We will assume that you have returned to your regular daily activities without incident.  If any biopsies were taken you will be contacted by phone or by letter within the next 1-3 weeks.  Please call us at [phone] if you have not heard about the biopsies in 3 weeks.    SIGNATURES/CONFIDENTIALITY: You and/or your care partner have signed paperwork which will be entered into your electronic medical record.  These signatures attest to the fact that that the information above on your After Visit Summary has been reviewed and is understood.  Full responsibility of the confidentiality of this discharge information lies with you and/or your care-partner.  Esophagitis information given.  Polyp and diverticulosis information given.  High  fiber diet information given.  Hemorrhoid information with banding phamplet given  Use fiber like citrucel, metamucil, konsyl, or fibercon daily.

## 2018-02-10 NOTE — Progress Notes (Signed)
Spontaneous respirations throughout. VSS. Resting comfortably. To PACU on room air. Report to  RN. 

## 2018-02-10 NOTE — Op Note (Signed)
[facility] Patient Name: Harold Brown Procedure Date: 02/10/2018 10:42 AM MRN: [id number] Endoscopist: Justice Britain , MD Age: 63 Referring MD:  Date of Birth: Jul 06, 1955 Gender: Male Account #: [id number] Procedure:                Colonoscopy Indications:              High risk colon cancer surveillance: Personal                            history of colonic polyps, Incidental -                            Periumbilical abdominal pain, Incidental - Lower                            abdominal pain, Incidental - Rectal bleeding Medicines:                Monitored Anesthesia Care Procedure:                Pre-Anesthesia Assessment:                           - Prior to the procedure, a History and Physical                            was performed, and patient medications and                            allergies were reviewed. The patient's tolerance of                            previous anesthesia was also reviewed. The risks                            and benefits of the procedure and the sedation                            options and risks were discussed with the patient.                            All questions were answered, and informed consent                            was obtained. Prior Anticoagulants: The patient has                            taken no previous anticoagulant or antiplatelet                            agents. ASA Grade Assessment: II - A patient with                            mild systemic disease. After reviewing the risks  and benefits, the patient was deemed in                            satisfactory condition to undergo the procedure.                           After obtaining informed consent, the colonoscope                            was passed under direct vision. Throughout the                            procedure, the patient's blood pressure, pulse, and                            oxygen saturations were  monitored continuously. The                            Colonoscope was introduced through the anus and                            advanced to the 5 cm into the ileum. The                            colonoscopy was performed without difficulty. The                            patient tolerated the procedure. The quality of the                            bowel preparation was evaluated using the BBPS                            [employer] Bowel Preparation Scale) with scores of:                            Right Colon = 3, Transverse Colon = 3 and Left                            Colon = 3 (entire mucosa seen well with no residual                            staining, small fragments of stool or opaque                            liquid). The total BBPS score equals 9. Scope In: 11:12:08 AM Scope Out: 11:27:36 AM Scope Withdrawal Time: 0 hours 12 minutes 43 seconds  Total Procedure Duration: 0 hours 15 minutes 28 seconds  Findings:                 The digital rectal exam findings include  non-thrombosed external hemorrhoids and                            non-thrombosed internal hemorrhoids. Pertinent                            negatives include no palpable rectal lesions.                           The terminal ileum and ileocecal valve appeared                            normal.                           Two sessile polyps were found in the descending                            colon and transverse colon. The polyps were 2 to 4                            mm in size. These polyps were removed with a cold                            snare. Resection and retrieval were complete.                           Multiple small-mouthed diverticula were found in                            the sigmoid colon, descending colon, transverse                            colon and ascending colon.                           Normal mucosa was found in the entire colon                             otherwise.                           Normal mucosa was found in the rectum. Biopsies                            were taken with a cold forceps for histology to                            rule out proctitis.                           Non-bleeding non-thrombosed external and internal                            hemorrhoids were found during retroflexion, during  perianal exam and during digital exam. The                            hemorrhoids were Grade II (internal hemorrhoids                            that prolapse but reduce spontaneously). Complications:            No immediate complications. Estimated Blood Loss:     Estimated blood loss was minimal. Impression:               - Non-thrombosed external hemorrhoids and                            non-thrombosed internal hemorrhoids found on                            digital rectal exam.                           - The examined portion of the ileum was normal.                           - Two 2 to 4 mm polyps in the descending colon and                            in the transverse colon, removed with a cold snare.                            Resected and retrieved.                           - Diverticulosis in the sigmoid colon, in the                            descending colon, in the transverse colon and in                            the ascending colon.                           - Normal mucosa in the entire examined colon                            otherwise.                           - Normal mucosa in the rectum. Biopsied.                           - Non-bleeding non-thrombosed external and internal                            hemorrhoids. Recommendation:           - The patient will be observed post-procedure,  until all discharge criteria are met.                           - Discharge patient to home.                           - Patient has a contact number available for                             emergencies. The signs and symptoms of potential                            delayed complications were discussed with the                            patient. Return to normal activities tomorrow.                            Written discharge instructions were provided to the                            patient.                           - High fiber diet.                           - Use fiber, for example Citrucel, Fibercon, Konsyl                            or Metamucil.                           - Await pathology results.                           - Repeat colonoscopy in 5 years for surveillance                            based on pathology results.                           - If rectal bleeding persists then will discuss                            possible role of hemorrhoidal banding +/- CRS                            evaluation.                           - Dependent on how patient is doing in follow up                            will consider role of cross-sectional CT-Abdomen to  further workup abdominal discomfort.                           - The findings and recommendations were discussed                            with the patient.                           - The findings and recommendations were discussed                            with the patient's family. Justice Britain, MD 02/10/2018 11:41:28 AM

## 2018-02-10 NOTE — Op Note (Addendum)
[facility] Patient Name: Harold Brown Procedure Date: 02/10/2018 10:42 AM MRN: [id number] Endoscopist: Justice Britain , MD Age: 63 Referring MD:  Date of Birth: 05-02-1955 Gender: Male Account #: [id number] Procedure:                Upper GI endoscopy Indications:              Periumbilical abdominal pain, Lower abdominal pain Medicines:                Monitored Anesthesia Care Procedure:                Pre-Anesthesia Assessment:                           - Prior to the procedure, a History and Physical                            was performed, and patient medications and                            allergies were reviewed. The patient's tolerance of                            previous anesthesia was also reviewed. The risks                            and benefits of the procedure and the sedation                            options and risks were discussed with the patient.                            All questions were answered, and informed consent                            was obtained. Prior Anticoagulants: The patient has                            taken no previous anticoagulant or antiplatelet                            agents. ASA Grade Assessment: II - A patient with                            mild systemic disease. After reviewing the risks                            and benefits, the patient was deemed in                            satisfactory condition to undergo the procedure.                           After obtaining informed consent, the endoscope was  passed under direct vision. Throughout the                            procedure, the patient's blood pressure, pulse, and                            oxygen saturations were monitored continuously. The                            Endoscope was introduced through the mouth, and                            advanced to the second part of duodenum. The upper    GI endoscopy was accomplished without difficulty.                            The patient tolerated the procedure. 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                            Biopsies were taken with a cold forceps for                            histology from throughout the esophagus.                           LA Grade B (one or more mucosal breaks greater than                            5 mm, not extending between the tops of two mucosal                            folds) esophagitis with no bleeding was found in                            the very distal esophagus and the GE Junction.                           The Z-line was irregular and was found 39 cm from                            the incisors.                           No gross lesions were noted in the entire examined  stomach. Biopsies were taken with a cold forceps                            for histology and Helicobacter pylori testing.                           No gross lesions were noted in the duodenal bulb,                            in the first portion of the duodenum and in the                            second portion of the duodenum. Complications:            No immediate complications. Estimated Blood Loss:     Estimated blood loss was minimal. 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 Biopsied.                           - LA Grade B reflux esophagitis.                           - Z-line irregular, 39 cm from the incisors.                           - No gross lesions in the stomach. Biopsied for HP.                           - No gross lesions in the duodenal bulb, in the                             first portion of the duodenum and in the second                            portion of the duodenum. Recommendation:           - Proceed to scheduled colonoscopy.                           - Await pathology results.                           - Observe patient's clinical course.                           - Start Omeprazole 40 mg daily for next 2-3 months                            and will consider repeat EGD to evaluate for  esophagitis healing and then ability to decrease                            acid reducing medication.                           - The findings and recommendations were discussed                            with the patient.                           - The findings and recommendations were discussed                            with the patient's family. Justice Britain, MD 02/10/2018 11:35:36 AM

## 2018-02-10 NOTE — Progress Notes (Signed)
Called to room to assist during endoscopic procedure.  Patient ID and intended procedure confirmed with present staff. Received instructions for my participation in the procedure from the performing physician.  

## 2018-02-11 ENCOUNTER — Telehealth: Payer: Self-pay | Admitting: *Deleted

## 2018-02-11 NOTE — Telephone Encounter (Signed)
  Follow up Call-  Call back number 02/10/2018  Post procedure Call Back phone  # [phone]  Permission to leave phone message Yes     Patient questions:  Do you have a fever, pain , or abdominal swelling? No. Pain Score  0 *  Have you tolerated food without any problems? Yes.    Have you been able to return to your normal activities? Yes.    Do you have any questions about your discharge instructions: Diet   No. Medications  No. Follow up visit  No.  Do you have questions or concerns about your Care? No.  Actions: * If pain score is 4 or above: No action needed, pain <4.

## 2018-02-14 ENCOUNTER — Encounter: Payer: Self-pay | Admitting: Gastroenterology

## 2018-02-25 ENCOUNTER — Telehealth (INDEPENDENT_AMBULATORY_CARE_PROVIDER_SITE_OTHER): Payer: Self-pay | Admitting: Orthopaedic Surgery

## 2018-02-25 NOTE — Telephone Encounter (Signed)
Patient called asked if he can get the gel injection. Patient said his insurance company is [employer]  [phone]  provider services   The number to contact patient is [phone]

## 2018-02-25 NOTE — Telephone Encounter (Signed)
Noted  

## 2018-02-25 NOTE — Telephone Encounter (Signed)
Per last chart note of Harold Brown, ok to do gel injections, will you please apply? Thanks.

## 2018-03-06 ENCOUNTER — Ambulatory Visit: Payer: BLUE CROSS/BLUE SHIELD | Admitting: Family Medicine

## 2018-03-06 ENCOUNTER — Encounter: Payer: Self-pay | Admitting: Family Medicine

## 2018-03-06 ENCOUNTER — Telehealth (INDEPENDENT_AMBULATORY_CARE_PROVIDER_SITE_OTHER): Payer: Self-pay

## 2018-03-06 VITALS — BP 124/83 | HR 70 | Temp 98.4°F | Resp 12 | Ht 69.0 in | Wt 174.1 lb

## 2018-03-06 DIAGNOSIS — E782 Mixed hyperlipidemia: Secondary | ICD-10-CM

## 2018-03-06 DIAGNOSIS — I10 Essential (primary) hypertension: Secondary | ICD-10-CM

## 2018-03-06 DIAGNOSIS — F411 Generalized anxiety disorder: Secondary | ICD-10-CM

## 2018-03-06 DIAGNOSIS — G4700 Insomnia, unspecified: Secondary | ICD-10-CM

## 2018-03-06 DIAGNOSIS — R0982 Postnasal drip: Secondary | ICD-10-CM

## 2018-03-06 LAB — COMPREHENSIVE METABOLIC PANEL
ALBUMIN: 4.9 g/dL (ref 3.5–5.2)
ALK PHOS: 75 U/L (ref 39–117)
ALT: 40 U/L (ref 0–53)
AST: 28 U/L (ref 0–37)
BUN: 16 mg/dL (ref 6–23)
CO2: 30 mEq/L (ref 19–32)
Calcium: 10.2 mg/dL (ref 8.4–10.5)
Chloride: 104 mEq/L (ref 96–112)
Creatinine, Ser: 1.1 mg/dL (ref 0.40–1.50)
GFR: 67.9 mL/min (ref >60.00)
Glucose, Bld: 98 mg/dL (ref 70–99)
Potassium: 5.1 mEq/L (ref 3.5–5.1)
Sodium: 143 mEq/L (ref 135–145)
Total Bilirubin: 0.7 mg/dL (ref 0.2–1.2)
Total Protein: 7.1 g/dL (ref 6.0–8.3)

## 2018-03-06 LAB — LIPID PANEL
Cholesterol: 149 mg/dL (ref 0–200)
HDL: 57.8 mg/dL (ref >39.00)
LDL Cholesterol: 64 mg/dL (ref 0–99)
NonHDL: 91.15
Total CHOL/HDL Ratio: 3
Triglycerides: 135 mg/dL (ref 0.0–149.0)
VLDL: 27 mg/dL (ref 0.0–40.0)

## 2018-03-06 MED ORDER — ALPRAZOLAM 0.5 MG PO TABS: ORAL_TABLET | ORAL | 2 refills | Status: DC

## 2018-03-06 MED ORDER — SERTRALINE HCL 50 MG PO TABS: 50.0000 mg | ORAL_TABLET | Freq: Every day | ORAL | 1 refills | Status: DC

## 2018-03-06 NOTE — Telephone Encounter (Signed)
Submitted VOB for SynviscOne, bilateral knee. 

## 2018-03-06 NOTE — Assessment & Plan Note (Signed)
Adequately controlled. No changes in current management. Continue low salt diet. Eye exam recommended annually. F/U in 6 months, before if needed.  

## 2018-03-06 NOTE — Patient Instructions (Addendum)
A few things to remember from today's visit:   Hypertension, essential, benign - Plan: Comprehensive metabolic panel  Insomnia, unspecified type  GAD (generalized anxiety disorder)  Hyperlipidemia, mixed - Plan: Comprehensive metabolic panel, Lipid panel  No changes today. Continue a healthful diet.  Please be sure medication list is accurate. If a new problem present, please set up appointment sooner than planned today.

## 2018-03-06 NOTE — Progress Notes (Signed)
HPI:   [name] is a 63 y.o. male, who is here today for 4 months follow up.   He was last seen on 12/05/17.  Since his last visit he has follow-up with GI, he underwent colonoscopy and EGD.  According to patient, there were some erythematous areas in the esophagus and colon polypectomy.  Five-year follow-up was recommended. He is currently on omeprazole 40 mg daily, which has helped tremendously with abdominal pain.  Hyperlipidemia: Currently on atorvastatin 20 mg daily. Following a low fat diet: He decrease amount of hamburgers, extending into his eating 1.  He also increased vegetable intake.  He has not noted side effects with medication.  Lab Results  Component Value Date   CHOL 225 (H) 09/03/2017   HDL 65.60 09/03/2017   LDLDIRECT 140.0 09/03/2017   TRIG 212.0 (H) 09/03/2017   CHOLHDL 3 09/03/2017   Hypertension: Currently he is on amlodipine-olmesartan 5-40 mg daily and metoprolol succinate 100 mg daily. He is tolerating medication. Mild lower extremity edema with prolonged standing, no erythema or pain. Denies severe/frequent headache, visual changes, chest pain, dyspnea, palpitation, claudication, focal weakness.   Lab Results  Component Value Date   CREATININE 0.99 09/03/2017   BUN 16 09/03/2017   NA 142 09/03/2017   K 3.9 09/03/2017   CL 102 09/03/2017   CO2 31 09/03/2017   Anxiety and insomnia: Currently on Sertraline 50 mg daily and Xanax 0.5 mg bid as needed.  He is taking doxepin 10 mg daily as needed, he sleeps better when he takes medication a longer hours. No side effects.  Denies depressed mood or suicidal thoughts.   He is still complaining of postnasal drainage, it is mild and seems to gradually improving. Negative for chills,fever,sore throat,cough,wheezing,or cough. No Hx of tobacco use.   Review of Systems  Constitutional: Negative for activity change, appetite change, fatigue and fever.  HENT: Positive for  postnasal drip. Negative for congestion, nosebleeds, rhinorrhea, sore throat and trouble swallowing.   Eyes: Negative for redness and visual disturbance.  Respiratory: Negative for cough, shortness of breath and wheezing.   Cardiovascular: Negative for chest pain and palpitations.  Gastrointestinal: Negative for abdominal pain, nausea and vomiting.  Genitourinary: Negative for decreased urine volume and hematuria.  Musculoskeletal: Negative for gait problem and myalgias.  Neurological: Negative for dizziness, syncope and weakness.  Psychiatric/Behavioral: Positive for sleep disturbance. Negative for confusion. The patient is nervous/anxious.     Current Outpatient Medications on File Prior to Visit  Medication Sig Dispense Refill  . amLODipine-olmesartan (AZOR) 5-40 MG tablet Take 1 tablet by mouth daily. 90 tablet 1  . Ascorbic Acid (VITAMIN C ADULT GUMMIES PO) Take by mouth 2 (two) times daily.    Marland Kitchen atorvastatin (LIPITOR) 20 MG tablet Take 1 tablet (20 mg total) by mouth daily. 90 tablet 3  . doxepin (SINEQUAN) 10 MG capsule Take 1 capsule (10 mg total) by mouth at bedtime as needed. 90 capsule 1  . doxycycline (VIBRA-TABS) 100 MG tablet     . metoprolol succinate (TOPROL-XL) 100 MG 24 hr tablet Take 1 tablet (100 mg total) by mouth daily. Take with or immediately following a meal. 90 tablet 2  . metroNIDAZOLE (METROGEL) 0.75 % gel     . Multiple Vitamins-Minerals (HM MULTIVITAMIN ADULT GUMMY PO) Take by mouth 2 (two) times daily.    Marland Kitchen omeprazole (PRILOSEC) 40 MG capsule Take 1 capsule (40 mg total) by mouth daily. 30 capsule 2   No current  facility-administered medications on file prior to visit.      Past Medical History:  Diagnosis Date  . Anxiety   . Arthritis   . HLD (hyperlipidemia)   . Hypertension   . Sleep apnea with use of continuous positive airway pressure (CPAP)    Allergies  Allergen Reactions  . Erythromycin Rash    Social History   Socioeconomic History  .  Marital status: Married    Spouse name: Not on file  . Number of children: 0  . Years of education: Not on file  . Highest education level: Not on file  Occupational History  . Not on file  Social Needs  . Financial resource strain: Not on file  . Food insecurity:    Worry: Not on file    Inability: Not on file  . Transportation needs:    Medical: Not on file    Non-medical: Not on file  Tobacco Use  . Smoking status: Never Smoker  . Smokeless tobacco: Never Used  Substance and Sexual Activity  . Alcohol use: Yes    Comment: 2 per day  . Drug use: Never  . Sexual activity: Yes  Lifestyle  . Physical activity:    Days per week: Not on file    Minutes per session: Not on file  . Stress: Not on file  Relationships  . Social connections:    Talks on phone: Not on file    Gets together: Not on file    Attends religious service: Not on file    Active member of club or organization: Not on file    Attends meetings of clubs or organizations: Not on file    Relationship status: Not on file  Other Topics Concern  . Not on file  Social History Narrative  . Not on file    Vitals:   03/06/18 1120  BP: 124/83  Pulse: 70  Resp: 12  Temp: 98.4 F (36.9 C)  SpO2: 98%   Body mass index is 25.71 kg/m.   Physical Exam  Nursing note and vitals reviewed. Constitutional: He is oriented to person, place, and time. He appears well-developed and well-nourished. No distress.  HENT:  Head: Normocephalic and atraumatic.  Mouth/Throat: Oropharynx is clear and moist and mucous membranes are normal.  Eyes: Pupils are equal, round, and reactive to light. Conjunctivae are normal.  Cardiovascular: Normal rate and regular rhythm.  No murmur heard. Pulses:      Dorsalis pedis pulses are 2+ on the right side and 2+ on the left side.  Respiratory: Effort normal and breath sounds normal. No respiratory distress.  GI: Soft. He exhibits no mass. There is no hepatomegaly. There is no  abdominal tenderness.  Musculoskeletal:        General: No edema.  Lymphadenopathy:    He has no cervical adenopathy.  Neurological: He is alert and oriented to person, place, and time. He has normal strength. No cranial nerve deficit. Gait normal.  Skin: Skin is warm. No rash noted. No erythema.  Psychiatric: His mood appears anxious.  Well groomed, good eye contact.     ASSESSMENT AND PLAN:   [name] was seen today for 4 months follow-up.  Orders Placed This Encounter  Procedures  . Comprehensive metabolic panel  . Lipid panel   Lab Results  Component Value Date   CHOL 149 03/06/2018   HDL 57.80 03/06/2018   LDLCALC 64 03/06/2018   LDLDIRECT 140.0 09/03/2017   TRIG 135.0 03/06/2018  CHOLHDL 3 03/06/2018   Lab Results  Component Value Date   ALT 40 03/06/2018   AST 28 03/06/2018   ALKPHOS 75 03/06/2018   BILITOT 0.7 03/06/2018   Lab Results  Component Value Date   CREATININE 1.10 03/06/2018   BUN 16 03/06/2018   NA 143 03/06/2018   K 5.1 03/06/2018   CL 104 03/06/2018   CO2 30 03/06/2018     Hyperlipidemia, mixed Tolerating atorvastatin 20 mg well,. no changes in current management, will follow FLP done today and will give further recommendations accordingly.   Insomnia Improved after retirement, decreasing the stress level. Dose of been 10 mg has also helped, so no changes in current management. Good sleep hygiene also recommended.  GAD (generalized anxiety disorder) Stable overall. No changes in sertraline 50 mg daily or alprazolam 0.5 mg twice daily as needed (no more than 50 tablets/month). He has one refill left from prior prescription of alprazolam (03/24/18).  I did send a new prescription that will start on 04/24/2018. Follow-up in 5 to 6 months, before if needed.  Hypertension, essential, benign Adequately controlled. No changes in current management. Continue low salt diet. Eye exam recommended annually. F/U in 6 months,  before if needed.   Postnasal drip Explained that postnasal drip could be related to GERD and/or allergies. Nasal irrigation with saline, Astelin nasal spray, and OTC antihistaminics may help.   Return in about 6 months (around 09/04/2018), or F/U.       Aryianna Earwood G. [place], MD  [facility]. [place] office.

## 2018-03-06 NOTE — Assessment & Plan Note (Signed)
Stable overall. No changes in sertraline 50 mg daily or alprazolam 0.5 mg twice daily as needed (no more than 50 tablets/month). He has one refill left from prior prescription of alprazolam (03/24/18).  I did send a new prescription that will start on 04/24/2018. Follow-up in 5 to 6 months, before if needed.

## 2018-03-06 NOTE — Assessment & Plan Note (Addendum)
Tolerating atorvastatin 20 mg well,. no changes in current management, will follow FLP done today and will give further recommendations accordingly.

## 2018-03-06 NOTE — Assessment & Plan Note (Signed)
Improved after retirement, decreasing the stress level. Dose of been 10 mg has also helped, so no changes in current management. Good sleep hygiene also recommended.

## 2018-03-07 ENCOUNTER — Encounter: Payer: Self-pay | Admitting: Family Medicine

## 2018-03-10 ENCOUNTER — Telehealth (INDEPENDENT_AMBULATORY_CARE_PROVIDER_SITE_OTHER): Payer: Self-pay | Admitting: Orthopaedic Surgery

## 2018-03-10 NOTE — Telephone Encounter (Signed)
Patient left a message wanting to f/u on his gel injections.  CB#[phone].  Thank you.

## 2018-03-12 DIAGNOSIS — D171 Benign lipomatous neoplasm of skin and subcutaneous tissue of trunk: Secondary | ICD-10-CM | POA: Diagnosis not present

## 2018-03-13 ENCOUNTER — Telehealth (INDEPENDENT_AMBULATORY_CARE_PROVIDER_SITE_OTHER): Payer: Self-pay

## 2018-03-13 NOTE — Telephone Encounter (Signed)
Talked with patient concerning gel injection.  Patient has an appointment 03/17/2018 for gel injection.

## 2018-03-13 NOTE — Telephone Encounter (Signed)
Talked with Elta Gu[place] V.at [employer] to clarify being In Network for J code [phone] and Administration Code 20610.  Per [employer], we are In Network for these two codes.  Reference# [id number] U,03/13/2018,10:10am.  Patient is Approved for SynviscOne, bilateral knee. Buy & Bill Covered at 100% of the allowable amount. Per Elta [place] V.patient has a Co-pay  Co-pay of $10.00 (required) No PA required  Appt.03/17/2018 with Dr. Erlinda Hong

## 2018-03-17 ENCOUNTER — Ambulatory Visit (INDEPENDENT_AMBULATORY_CARE_PROVIDER_SITE_OTHER): Payer: BLUE CROSS/BLUE SHIELD | Admitting: Orthopaedic Surgery

## 2018-03-17 DIAGNOSIS — M170 Bilateral primary osteoarthritis of knee: Secondary | ICD-10-CM | POA: Diagnosis not present

## 2018-03-17 MED ORDER — LIDOCAINE HCL 1 % IJ SOLN
5.0000 mL | INTRAMUSCULAR | Status: AC | PRN
  Administered 2018-03-17: 5 mL

## 2018-03-17 MED ORDER — HYLAN G-F 20 48 MG/6ML IX SOSY
48.0000 mg | PREFILLED_SYRINGE | INTRA_ARTICULAR | Status: AC | PRN
  Administered 2018-03-17: 48 mg via INTRA_ARTICULAR

## 2018-03-17 NOTE — Progress Notes (Signed)
   Procedure Note  Patient: Harold Brown             Date of Birth: 04/18/1955           MRN: [id number]             Visit Date: 03/17/2018  Procedures: Visit Diagnoses: Bilateral primary osteoarthritis of knee  Large Joint Inj: bilateral knee on 03/17/2018 2:02 PM Indications: pain Details: 22 G needle  Arthrogram: No  Medications (Right): 5 mL lidocaine 1 %; 48 mg Hylan 48 MG/6ML Medications (Left): 5 mL lidocaine 1 %; 48 mg Hylan 48 MG/6ML Outcome: tolerated well, no immediate complications Patient was prepped and draped in the usual sterile fashion.

## 2018-03-30 DIAGNOSIS — L700 Acne vulgaris: Secondary | ICD-10-CM | POA: Diagnosis not present

## 2018-05-03 ENCOUNTER — Other Ambulatory Visit: Payer: Self-pay | Admitting: Family Medicine

## 2018-05-03 DIAGNOSIS — I10 Essential (primary) hypertension: Secondary | ICD-10-CM

## 2018-05-26 ENCOUNTER — Encounter: Payer: Self-pay | Admitting: Gastroenterology

## 2018-05-28 ENCOUNTER — Other Ambulatory Visit: Payer: Self-pay | Admitting: Family Medicine

## 2018-05-28 DIAGNOSIS — G4700 Insomnia, unspecified: Secondary | ICD-10-CM

## 2018-05-29 ENCOUNTER — Other Ambulatory Visit: Payer: Self-pay | Admitting: Gastroenterology

## 2018-06-01 ENCOUNTER — Other Ambulatory Visit: Payer: Self-pay | Admitting: Gastroenterology

## 2018-06-02 ENCOUNTER — Other Ambulatory Visit: Payer: Self-pay | Admitting: General Surgery

## 2018-06-02 DIAGNOSIS — D171 Benign lipomatous neoplasm of skin and subcutaneous tissue of trunk: Secondary | ICD-10-CM | POA: Diagnosis not present

## 2018-06-02 DIAGNOSIS — D1739 Benign lipomatous neoplasm of skin and subcutaneous tissue of other sites: Secondary | ICD-10-CM | POA: Diagnosis not present

## 2018-06-15 ENCOUNTER — Ambulatory Visit (AMBULATORY_SURGERY_CENTER): Payer: Self-pay

## 2018-06-15 ENCOUNTER — Other Ambulatory Visit: Payer: Self-pay

## 2018-06-15 VITALS — Ht 72.0 in | Wt 175.0 lb

## 2018-06-15 DIAGNOSIS — I8500 Esophageal varices without bleeding: Secondary | ICD-10-CM

## 2018-06-15 NOTE — Progress Notes (Signed)
Denies allergies to eggs or soy products. Denies complication of anesthesia or sedation. Denies use of weight loss medication. Denies use of O2.   Emmi instructions declined.   Pre-Visit was conducted by phone due to Covid 19. Instructions were reviewed and mailed to patients confirmed home address. Patient was encouraged to call if he had any questions or concerns regarding instructions.

## 2018-07-01 ENCOUNTER — Telehealth: Payer: Self-pay | Admitting: Gastroenterology

## 2018-07-01 NOTE — Telephone Encounter (Signed)
Pt answered "no" no all COVID-19 screening questions.

## 2018-07-01 NOTE — Telephone Encounter (Signed)

## 2018-07-02 ENCOUNTER — Encounter: Payer: Self-pay | Admitting: Gastroenterology

## 2018-07-02 ENCOUNTER — Ambulatory Visit (AMBULATORY_SURGERY_CENTER): Payer: BLUE CROSS/BLUE SHIELD | Admitting: Gastroenterology

## 2018-07-02 ENCOUNTER — Other Ambulatory Visit: Payer: Self-pay

## 2018-07-02 VITALS — BP 129/92 | HR 60 | Temp 98.4°F | Resp 22 | Ht 72.0 in | Wt 175.0 lb

## 2018-07-02 DIAGNOSIS — K219 Gastro-esophageal reflux disease without esophagitis: Secondary | ICD-10-CM | POA: Diagnosis not present

## 2018-07-02 DIAGNOSIS — K210 Gastro-esophageal reflux disease with esophagitis: Secondary | ICD-10-CM

## 2018-07-02 DIAGNOSIS — I8500 Esophageal varices without bleeding: Secondary | ICD-10-CM

## 2018-07-02 MED ORDER — SODIUM CHLORIDE 0.9 % IV SOLN: 500.0000 mL | Freq: Once | INTRAVENOUS | Status: DC

## 2018-07-02 NOTE — Progress Notes (Signed)
Courtney Washington took temp and Judy Branson took vitals. 

## 2018-07-02 NOTE — Progress Notes (Signed)
Called to room to assist during endoscopic procedure.  Patient ID and intended procedure confirmed with present staff. Received instructions for my participation in the procedure from the performing physician.  

## 2018-07-02 NOTE — Patient Instructions (Signed)
Await pathology results. Follow up in GI clinic in 3-4 months.  Continue present medications on once daily 40 mg omeprazole.  YOU HAD AN ENDOSCOPIC PROCEDURE TODAY AT [facility]:   Refer to the procedure report that was given to you for any specific questions about what was found during the examination.  If the procedure report does not answer your questions, please call your gastroenterologist to clarify.  If you requested that your care partner not be given the details of your procedure findings, then the procedure report has been included in a sealed envelope for you to review at your convenience later.  YOU SHOULD EXPECT: Some feelings of bloating in the abdomen. Passage of more gas than usual.  Walking can help get rid of the air that was put into your GI tract during the procedure and reduce the bloating. If you had a lower endoscopy (such as a colonoscopy or flexible sigmoidoscopy) you may notice spotting of blood in your stool or on the toilet paper. If you underwent a bowel prep for your procedure, you may not have a normal bowel movement for a few days.  Please Note:  You might notice some irritation and congestion in your nose or some drainage.  This is from the oxygen used during your procedure.  There is no need for concern and it should clear up in a day or so.  SYMPTOMS TO REPORT IMMEDIATELY:    Following upper endoscopy (EGD)  Vomiting of blood or coffee ground material  New chest pain or pain under the shoulder blades  Painful or persistently difficult swallowing  New shortness of breath  Fever of 100F or higher  Black, tarry-looking stools  For urgent or emergent issues, a gastroenterologist can be reached at any hour by calling [phone].   DIET:  We do recommend a small meal at first, but then you may proceed to your regular diet.  Drink plenty of fluids but you should avoid alcoholic beverages for 24 hours.  ACTIVITY:  You should plan to take  it easy for the rest of today and you should NOT DRIVE or use heavy machinery until tomorrow (because of the sedation medicines used during the test).    FOLLOW UP: Our staff will call the number listed on your records 48-72 hours following your procedure to check on you and address any questions or concerns that you may have regarding the information given to you following your procedure. If we do not reach you, we will leave a message.  We will attempt to reach you two times.  During this call, we will ask if you have developed any symptoms of COVID 19. If you develop any symptoms (ie: fever, flu-like symptoms, shortness of breath, cough etc.) before then, please call [phone].  If you test positive for Covid 19 in the 2 weeks post procedure, please call and report this information to [place].    If any biopsies were taken you will be contacted by phone or by letter within the next 1-3 weeks.  Please call us at [phone] if you have not heard about the biopsies in 3 weeks.    SIGNATURES/CONFIDENTIALITY: You and/or your care partner have signed paperwork which will be entered into your electronic medical record.  These signatures attest to the fact that that the information above on your After Visit Summary has been reviewed and is understood.  Full responsibility of the confidentiality of this discharge information lies with you and/or your care-partner.

## 2018-07-02 NOTE — Progress Notes (Signed)
Pt's states no medical or surgical changes since previsit or office visit. 

## 2018-07-02 NOTE — Op Note (Signed)
[facility] Patient Name: Harold Brown Procedure Date: 07/02/2018 11:50 AM MRN: [id number] Endoscopist: Gabriel Mansouraty , MD Age: 63 Referring MD:  Date of Birth: 03/29/1955 Gender: Male Account #: [id number] Procedure:                Upper GI endoscopy Indications:              Reflux esophagitis, Follow-up of reflux esophagitis                            (Patient feels improved). Medicines:                Monitored Anesthesia Care Procedure:                Pre-Anesthesia Assessment:                           - Prior to the procedure, a History and Physical                            was performed, and patient medications and                            allergies were reviewed. The patient's tolerance of                            previous anesthesia was also reviewed. The risks                            and benefits of the procedure and the sedation                            options and risks were discussed with the patient.                            All questions were answered, and informed consent                            was obtained. Prior Anticoagulants: The patient has                            taken no previous anticoagulant or antiplatelet                            agents. ASA Grade Assessment: II - A patient with                            mild systemic disease. After reviewing the risks                            and benefits, the patient was deemed in                            satisfactory condition to undergo the procedure.                             After obtaining informed consent, the endoscope was                            passed under direct vision. Throughout the                            procedure, the patient's blood pressure, pulse, and                            oxygen saturations were monitored continuously. The                            Endoscope was introduced through the mouth, and                            advanced to the second  part of duodenum. The upper                            GI endoscopy was accomplished without difficulty.                            The patient tolerated the procedure. Scope In: Scope Out: Findings:                 No gross lesions were noted in the proximal                            esophagus and in the mid esophagus. Previous                            esophagitis has healed                           Scattered islands of salmon-colored mucosa were                            present at 39 cm. No other visible abnormalities                            were present. Biopsies were taken with a cold                            forceps for histology to rule out Barrett's.                           No gross lesions were noted in the entire examined                            stomach.                           No gross lesions were noted in the duodenal bulb,                            in the first  portion of the duodenum and in the                            second portion of the duodenum. Complications:            No immediate complications. Estimated Blood Loss:     Estimated blood loss was minimal. Impression:               - No gross lesions in esophagus. Previous                            esophagitis has healed.                           - Salmon-colored mucosa. Biopsied to rule out                            Barrett's.                           - No gross lesions in the stomach.                           - No gross lesions in the duodenal bulb, in the                            first portion of the duodenum and in the second                            portion of the duodenum. Recommendation:           - The patient will be observed post-procedure,                            until all discharge criteria are met.                           - Discharge patient to home.                           - Patient has a contact number available for                            emergencies. The signs  and symptoms of potential                            delayed complications were discussed with the                            patient. Return to normal activities tomorrow.                            Written discharge instructions were provided to the                            patient.                           -  Resume previous diet.                           - Continue present medications on once daily 40 mg.                            Will consider decreasing to 20 mg if no overt                            Barrett's and if patient remains stable clinically.                           - Await pathology results.                           - Repeat upper endoscopy if the patient's biopsies                            return showing evidence of Barrett's esophagus.                           - Schedule follow up in clinic in 3-4 months.                           - The findings and recommendations were discussed                            with the patient. Gabriel Mansouraty, MD 07/02/2018 12:07:58 PM 

## 2018-07-02 NOTE — Progress Notes (Signed)
A and O x3. Report to RN. Tolerated MAC anesthesia well.Teeth unchanged after procedure.

## 2018-07-06 ENCOUNTER — Encounter: Payer: Self-pay | Admitting: Gastroenterology

## 2018-07-06 ENCOUNTER — Telehealth: Payer: Self-pay

## 2018-07-06 NOTE — Telephone Encounter (Signed)
  Follow up Call-  Call back number 07/02/2018 02/10/2018  Post procedure Call Back phone  # [phone]  Permission to leave phone message Yes Yes     Patient questions:  Do you have a fever, pain , or abdominal swelling? No. Pain Score  0 *  Have you tolerated food without any problems? Yes.    Have you been able to return to your normal activities? Yes.    Do you have any questions about your discharge instructions: Diet   No. Medications  No. Follow up visit  No.  Do you have questions or concerns about your Care? No.  Actions: * If pain score is 4 or above: No action needed, pain <4.  1. Have you developed a fever since your procedure? no  2.   Have you had an respiratory symptoms (SOB or cough) since your procedure? no  3.   Have you tested positive for COVID 19 since your procedure no  4.   Have you had any family members/close contacts diagnosed with the COVID 19 since your procedure?  no   If yes to any of these questions please route to Joylene John, RN and Alphonsa Gin, Therapist, sports.

## 2018-07-10 ENCOUNTER — Other Ambulatory Visit: Payer: Self-pay | Admitting: Family Medicine

## 2018-07-25 ENCOUNTER — Other Ambulatory Visit: Payer: Self-pay | Admitting: Gastroenterology

## 2018-08-04 ENCOUNTER — Other Ambulatory Visit: Payer: Self-pay | Admitting: Family Medicine

## 2018-09-04 ENCOUNTER — Other Ambulatory Visit: Payer: Self-pay

## 2018-09-04 ENCOUNTER — Ambulatory Visit: Payer: BLUE CROSS/BLUE SHIELD | Admitting: Family Medicine

## 2018-09-04 ENCOUNTER — Encounter: Payer: Self-pay | Admitting: Family Medicine

## 2018-09-04 VITALS — BP 120/80 | HR 60 | Temp 97.9°F | Resp 12 | Ht 72.0 in | Wt 181.2 lb

## 2018-09-04 DIAGNOSIS — E875 Hyperkalemia: Secondary | ICD-10-CM

## 2018-09-04 DIAGNOSIS — F411 Generalized anxiety disorder: Secondary | ICD-10-CM

## 2018-09-04 DIAGNOSIS — I10 Essential (primary) hypertension: Secondary | ICD-10-CM | POA: Diagnosis not present

## 2018-09-04 DIAGNOSIS — G4700 Insomnia, unspecified: Secondary | ICD-10-CM | POA: Diagnosis not present

## 2018-09-04 DIAGNOSIS — L219 Seborrheic dermatitis, unspecified: Secondary | ICD-10-CM | POA: Diagnosis not present

## 2018-09-04 LAB — BASIC METABOLIC PANEL
BUN: 18 mg/dL (ref 6–23)
CO2: 31 mEq/L (ref 19–32)
Calcium: 10.3 mg/dL (ref 8.4–10.5)
Chloride: 103 mEq/L (ref 96–112)
Creatinine, Ser: 1.08 mg/dL (ref 0.40–1.50)
GFR: 69.24 mL/min (ref >60.00)
Glucose, Bld: 108 mg/dL — ABNORMAL HIGH (ref 70–99)
Potassium: 5.3 mEq/L — ABNORMAL HIGH (ref 3.5–5.1)
Sodium: 142 mEq/L (ref 135–145)

## 2018-09-04 MED ORDER — KETOCONAZOLE 2 % EX SHAM: 1.0000 "application " | MEDICATED_SHAMPOO | CUTANEOUS | 0 refills | Status: DC

## 2018-09-04 MED ORDER — BETAMETHASONE VALERATE 0.12 % EX FOAM: 1.0000 "application " | Freq: Two times a day (BID) | CUTANEOUS | 1 refills | Status: DC | PRN

## 2018-09-04 NOTE — Patient Instructions (Signed)
A few things to remember from today's visit:   Insomnia, unspecified type  Hypertension, essential, benign - Plan: Basic metabolic panel  GAD (generalized anxiety disorder)  Seborrheic dermatitis of scalp - Plan: ketoconazole (NIZORAL) 2 % shampoo, Betamethasone Valerate 0.12 % foam  Seborrheic Dermatitis, Adult Seborrheic dermatitis is a skin disease that causes red, scaly patches. It usually occurs on the scalp, and it is often called dandruff. The patches may appear on other parts of the body. Skin patches tend to appear where there are many oil glands in the skin. Areas of the body that are commonly affected include:  Scalp.  Skin folds of the body.  Ears.  Eyebrows.  Neck.  Face.  Armpits.  The bearded area of men's faces. The condition may come and go for no known reason, and it is often long-lasting (chronic). What are the causes? The cause of this condition is not known. What increases the risk? This condition is more likely to develop in people who:  Have certain conditions, such as: ? HIV (human immunodeficiency virus). ? AIDS (acquired immunodeficiency syndrome). ? Parkinson disease. ? Mood disorders, such as depression.  Are 23-82 years old. What are the signs or symptoms? Symptoms of this condition include:  Thick scales on the scalp.  Redness on the face or in the armpits.  Skin that is flaky. The flakes may be white or yellow.  Skin that seems oily or dry but is not helped with moisturizers.  Itching or burning in the affected areas. How is this diagnosed? This condition is diagnosed with a medical history and physical exam. A sample of your skin may be tested (skin biopsy). You may need to see a skin specialist (dermatologist). How is this treated? There is no cure for this condition, but treatment can help to manage the symptoms. You may get treatment to remove scales, lower the risk of skin infection, and reduce swelling or itching. Treatment  may include:  Creams that reduce swelling and irritation (steroids).  Creams that reduce skin yeast.  Medicated shampoo, soaps, moisturizing creams, or ointments.  Medicated moisturizing creams or ointments. Follow these instructions at home:  Apply over-the-counter and prescription medicines only as told by your health care provider.  Use any medicated shampoo, soaps, skin creams, or ointments only as told by your health care provider.  Keep all follow-up visits as told by your health care provider. This is important. Contact a health care provider if:  Your symptoms do not improve with treatment.  Your symptoms get worse.  You have new symptoms. This information is not intended to replace advice given to you by your health care provider. Make sure you discuss any questions you have with your health care provider. Document Released: 12/31/2004 Document Revised: 12/13/2016 Document Reviewed: 04/20/2015 Elsevier Patient Education  [employer].   Please be sure medication list is accurate. If a new problem present, please set up appointment sooner than planned today.

## 2018-09-04 NOTE — Progress Notes (Signed)
HPI:   [name] is a 63 y.o. male, who is here today for chronic disease management.  He was last seen on 03/06/2018. Since his last visit he has follow-up with GI, he had colonoscopy and EGD. He is no longer having epigastric pain,currently he is on omeprazole 20 mg daily.  Hypertension: Currently on Amlodipine_olmesartan 5-40 mg daily and Metoprolol Succinate 100 mg daily.  He is taking medications as instructed, no side effects reported. He is not checking BP at home.  He has not noted unusual headache, visual changes, exertional chest pain, dyspnea,  focal weakness, or edema.  He is walking daily and following a healthful diet.  Lab Results  Component Value Date   CREATININE 1.10 03/06/2018   BUN 16 03/06/2018   NA 143 03/06/2018   K 5.1 03/06/2018   CL 104 03/06/2018   CO2 30 03/06/2018    Anxiety and insomnia: He is on Alprazolam 0.5 mg bid as needed and Sertraline 50 mg daily. Tolerating med well. He is dealing with recent events related to COVID 19. Denies depressed mood or suicidal thoughts. Stopped Doxepin, did not feel like he needed it.  Today he is c/o very pruritic scalp that started about 3 weeks ago after his wife applied coconut oil on his hair. No prior history.  He has noted some scabs on scalp,which he scratches causing mild bleeding. Lesions are not tender. No sick contact.  Negative for fever,chills,body aches,or unusual fatigue.  Review of Systems  Constitutional: Negative for activity change and appetite change.  HENT: Negative for mouth sores, nosebleeds and sore throat.   Eyes: Negative for pain and redness.  Respiratory: Negative for cough and wheezing.   Gastrointestinal: Negative for abdominal pain, nausea and vomiting.  Endocrine: Negative for cold intolerance and heat intolerance.  Genitourinary: Negative for decreased urine volume and hematuria.  Musculoskeletal: Negative for gait problem and neck pain.  Skin:  Positive for rash. Negative for wound.  Allergic/Immunologic: Positive for environmental allergies.  Neurological: Negative for syncope, facial asymmetry and weakness.  Psychiatric/Behavioral: Negative for confusion. The patient is nervous/anxious.   Rest see pertinent positives and negatives per HPI.   Current Outpatient Medications on File Prior to Visit  Medication Sig Dispense Refill  . ALPRAZolam (XANAX) 0.5 MG tablet TAKE 1 TABLET BY MOUTH TWICE A DAY AS NEEDED FOR ANXIETY,50 TABS PER MONTH 50 tablet 3  . amLODipine-olmesartan (AZOR) 5-40 MG tablet TAKE 1 TABLET BY MOUTH EVERY DAY 90 tablet 1  . Ascorbic Acid (VITAMIN C ADULT GUMMIES PO) Take by mouth 2 (two) times daily.    Marland Kitchen atorvastatin (LIPITOR) 20 MG tablet TAKE 1 TABLET BY MOUTH EVERY DAY 90 tablet 3  . metoprolol succinate (TOPROL-XL) 100 MG 24 hr tablet Take 1 tablet (100 mg total) by mouth daily. Take with or immediately following a meal. 90 tablet 2  . metroNIDAZOLE (METROGEL) 0.75 % gel     . Multiple Vitamins-Minerals (HM MULTIVITAMIN ADULT GUMMY PO) Take by mouth 2 (two) times daily.    Marland Kitchen omeprazole (PRILOSEC) 40 MG capsule TAKE 1 CAPSULE BY MOUTH EVERY DAY 30 capsule 2   No current facility-administered medications on file prior to visit.      Past Medical History:  Diagnosis Date  . Allergy   . Anxiety   . Arthritis   . GERD (gastroesophageal reflux disease)   . HLD (hyperlipidemia)   . Hypertension   . Sleep apnea   . Sleep apnea with use  of continuous positive airway pressure (CPAP)    Allergies  Allergen Reactions  . Coconut Oil Other (See Comments)    Itching and scabs in head after washing hair with it  . Erythromycin Rash    Social History   Socioeconomic History  . Marital status: Married    Spouse name: Not on file  . Number of children: 0  . Years of education: Not on file  . Highest education level: Not on file  Occupational History  . Not on file  Social Needs  . Financial resource  strain: Not on file  . Food insecurity    Worry: Not on file    Inability: Not on file  . Transportation needs    Medical: Not on file    Non-medical: Not on file  Tobacco Use  . Smoking status: Never Smoker  . Smokeless tobacco: Never Used  Substance and Sexual Activity  . Alcohol use: Yes    Comment: 2 per day  . Drug use: Never  . Sexual activity: Yes  Lifestyle  . Physical activity    Days per week: Not on file    Minutes per session: Not on file  . Stress: Not on file  Relationships  . Social Herbalist on phone: Not on file    Gets together: Not on file    Attends religious service: Not on file    Active member of club or organization: Not on file    Attends meetings of clubs or organizations: Not on file    Relationship status: Not on file  Other Topics Concern  . Not on file  Social History Narrative  . Not on file    Vitals:   09/04/18 1146  BP: 120/80  Pulse: 60  Resp: 12  Temp: 97.9 F (36.6 C)  SpO2: 98%   Body mass index is 24.58 kg/m.  Physical Exam  Nursing note and vitals reviewed. Constitutional: He is oriented to person, place, and time. He appears well-developed and well-nourished. No distress.  HENT:  Head: Normocephalic and atraumatic.  Mouth/Throat: Oropharynx is clear and moist and mucous membranes are normal.  Eyes: Pupils are equal, round, and reactive to light. Conjunctivae are normal.  Cardiovascular: Normal rate and regular rhythm.  No murmur heard. Pulses:      Dorsalis pedis pulses are 2+ on the right side and 2+ on the left side.  Respiratory: Effort normal and breath sounds normal. No respiratory distress.  GI: Soft. He exhibits no mass. There is no hepatomegaly. There is no abdominal tenderness.  Musculoskeletal:        General: No edema.  Lymphadenopathy:    He has no cervical adenopathy.  Neurological: He is alert and oriented to person, place, and time. He has normal strength. No cranial nerve deficit. Gait  normal.  Skin: Skin is warm. No erythema.  Superficial excoriations on parieto-occipital scalp with fine scaling scattered in hair. No erythema or local edema. No tenderness present.  Psychiatric: His mood appears anxious.  Well groomed, good eye contact.    ASSESSMENT AND PLAN:  [name] was seen today for follow-up.  Diagnoses and all orders for this visit:  Lab Results  Component Value Date   CREATININE 1.08 09/04/2018   BUN 18 09/04/2018   NA 142 09/04/2018   K 5.3 No hemolysis seen (H) 09/04/2018   CL 103 09/04/2018   CO2 31 09/04/2018    Insomnia, unspecified type Problem is well controlled. No changes  in current management,Xanax 0.5 mg bid as needed. Good sleep hygiene.  Hypertension, essential, benign BP adequately controlled. Continue current management. Recommend monitoring BP regularly. Low salt diet to continue. Eye exam is current.  -     Basic metabolic panel  GAD (generalized anxiety disorder) Problem is well controlled. Continue Sertraline 50 mg and Xanax 0.5 mg bid as needed. Some side effects reviewed. No changes today.  Seborrheic dermatitis of scalp Reassured. Educated about Dx and prognosis. Shampoo and topical steroid recommended.  Recommend avoiding scratching area. Instructed about warning signs.  -     ketoconazole (NIZORAL) 2 % shampoo; Apply 1 application topically 2 (two) times a week. -     Betamethasone Valerate 0.12 % foam; Apply 1 application topically 2 (two) times daily as needed.   Will schedule lab appt to recheck K+  Return in about 27 weeks (around 03/12/2019) for cpe.   -[name] was advised to return sooner than planned today if new concerns arise.    G. [place], MD  [facility]. [place] office.

## 2018-09-05 MED ORDER — SERTRALINE HCL 50 MG PO TABS: 50.0000 mg | ORAL_TABLET | Freq: Every day | ORAL | 1 refills | Status: DC

## 2018-09-08 ENCOUNTER — Other Ambulatory Visit: Payer: Self-pay

## 2018-09-08 ENCOUNTER — Other Ambulatory Visit (INDEPENDENT_AMBULATORY_CARE_PROVIDER_SITE_OTHER): Payer: BLUE CROSS/BLUE SHIELD

## 2018-09-08 DIAGNOSIS — E875 Hyperkalemia: Secondary | ICD-10-CM | POA: Diagnosis not present

## 2018-09-08 LAB — POTASSIUM: Potassium: 5.5 mEq/L — ABNORMAL HIGH (ref 3.5–5.1)

## 2018-09-09 ENCOUNTER — Other Ambulatory Visit: Payer: Self-pay | Admitting: *Deleted

## 2018-09-09 NOTE — Progress Notes (Signed)
Patient agrees to try Kayexalate and dose changes of blood pressure medications.

## 2018-09-15 ENCOUNTER — Other Ambulatory Visit: Payer: Self-pay | Admitting: Family Medicine

## 2018-09-15 DIAGNOSIS — E875 Hyperkalemia: Secondary | ICD-10-CM

## 2018-09-15 MED ORDER — AMLODIPINE-OLMESARTAN 10-20 MG PO TABS: 1.0000 | ORAL_TABLET | Freq: Every day | ORAL | 1 refills | Status: DC

## 2018-09-15 MED ORDER — SODIUM POLYSTYRENE SULFONATE PO POWD: Freq: Once | ORAL | 0 refills | Status: AC

## 2018-09-24 ENCOUNTER — Encounter: Payer: Self-pay | Admitting: Orthopaedic Surgery

## 2018-09-24 ENCOUNTER — Ambulatory Visit (INDEPENDENT_AMBULATORY_CARE_PROVIDER_SITE_OTHER): Payer: BLUE CROSS/BLUE SHIELD | Admitting: Orthopaedic Surgery

## 2018-09-24 VITALS — Ht 72.0 in | Wt 175.0 lb

## 2018-09-24 DIAGNOSIS — M170 Bilateral primary osteoarthritis of knee: Secondary | ICD-10-CM | POA: Diagnosis not present

## 2018-09-24 MED ORDER — LIDOCAINE HCL 1 % IJ SOLN
2.0000 mL | INTRAMUSCULAR | Status: AC | PRN
  Administered 2018-09-24: 2 mL

## 2018-09-24 MED ORDER — METHYLPREDNISOLONE ACETATE 40 MG/ML IJ SUSP
40.0000 mg | INTRAMUSCULAR | Status: AC | PRN
  Administered 2018-09-24: 40 mg via INTRA_ARTICULAR

## 2018-09-24 MED ORDER — BUPIVACAINE HCL 0.5 % IJ SOLN
2.0000 mL | INTRAMUSCULAR | Status: AC | PRN
  Administered 2018-09-24: 2 mL via INTRA_ARTICULAR

## 2018-09-24 NOTE — Progress Notes (Signed)
Office Visit Note   Patient: Harold Brown           Date of Birth: 1955-11-05           MRN: [id number] Visit Date: 09/24/2018              Requested by: Martinique, Betty G, MD [address] [place],  [place] [address] PCP: Martinique, Betty G, MD   Assessment & Plan: Visit Diagnoses:  1. Bilateral primary osteoarthritis of knee     Plan: Bilateral knee cortisone injections performed today.  We will submit authorization for Synvisc injections for his knees.  Questions encouraged and answered.  Follow-up as needed.  Follow-Up Instructions: Return if symptoms worsen or fail to improve.   Orders:  No orders of the defined types were placed in this encounter.  No orders of the defined types were placed in this encounter.     Procedures: Large Joint Inj: bilateral knee on 09/24/2018 10:54 AM Indications: pain Details: 22 G needle  Arthrogram: No  Medications (Right): 2 mL lidocaine 1 %; 2 mL bupivacaine 0.5 %; 40 mg methylPREDNISolone acetate 40 MG/ML Medications (Left): 2 mL lidocaine 1 %; 2 mL bupivacaine 0.5 %; 40 mg methylPREDNISolone acetate 40 MG/ML Outcome: tolerated well, no immediate complications Patient was prepped and draped in the usual sterile fashion.       Clinical Data: No additional findings.   Subjective: Chief Complaint  Patient presents with  . Right Knee - Pain  . Left Knee - Pain    Harold Brown comes in today for follow-up of bilateral knee pain.  He would like cortisone injections today.  I have been seeing him for bilateral knee osteoarthritis.  He has had a flareup due to recent increase in activity and doing a lot of housework.  He is requesting injections today.  He is also requesting another round of Synvisc injections.   Review of Systems   Objective: Vital Signs: Ht 6' (1.829 m)   Wt 175 lb (79.4 kg)   BMI 23.73 kg/m   Physical Exam  Ortho Exam Bilateral knee exam is unchanged.  No joint effusion. Specialty Comments:   No specialty comments available.  Imaging: No results found.   PMFS History: Patient Active Problem List   Diagnosis Date Noted  . Hyperlipidemia, mixed 03/06/2018  . Periumbilical pain 123456  . History of colonic polyps 01/27/2018  . Cystic acne vulgaris 11/03/2017  . Insomnia 11/03/2017  . GAD (generalized anxiety disorder) 11/03/2017  . Hypertension, essential, benign 11/03/2017  . Bilateral primary osteoarthritis of knee 09/11/2017   Past Medical History:  Diagnosis Date  . Allergy   . Anxiety   . Arthritis   . GERD (gastroesophageal reflux disease)   . HLD (hyperlipidemia)   . Hypertension   . Sleep apnea   . Sleep apnea with use of continuous positive airway pressure (CPAP)     Family History  Problem Relation Age of Onset  . Arthritis Mother   . Breast cancer Mother   . Diabetes Father   . Heart disease Father   . Colon cancer Neg Hx   . Esophageal cancer Neg Hx   . Inflammatory bowel disease Neg Hx   . Liver disease Neg Hx   . Pancreatic cancer Neg Hx   . Rectal cancer Neg Hx   . Stomach cancer Neg Hx     Past Surgical History:  Procedure Laterality Date  . BICEPS TENDON REPAIR Right 11/2015   upper  .  broken finger Right    Bone fusion from the broken finger  . ROTATOR CUFF REPAIR Right 2013, 2017   Social History   Occupational History  . Not on file  Tobacco Use  . Smoking status: Never Smoker  . Smokeless tobacco: Never Used  Substance and Sexual Activity  . Alcohol use: Yes    Comment: 2 per day  . Drug use: Never  . Sexual activity: Yes

## 2018-09-29 ENCOUNTER — Other Ambulatory Visit: Payer: Self-pay | Admitting: Family Medicine

## 2018-09-29 DIAGNOSIS — L219 Seborrheic dermatitis, unspecified: Secondary | ICD-10-CM

## 2018-09-29 NOTE — Telephone Encounter (Signed)
Please advise. Should patient continue this medication. 

## 2018-10-02 ENCOUNTER — Telehealth: Payer: Self-pay

## 2018-10-02 NOTE — Telephone Encounter (Signed)
Submitted VOB for SynviscOne, bilateral knee. 

## 2018-10-06 ENCOUNTER — Telehealth: Payer: Self-pay

## 2018-10-06 NOTE — Telephone Encounter (Signed)
Patient aware that he is approved for gel injection.  Approved for SynviscOne, bilateral knee. Buy & Bill Covered at 100% after Co-pay. Co-pay of $10.00 required No PA required  Appt. 10/08/2018 with Dr. Erlinda Hong.

## 2018-10-08 ENCOUNTER — Encounter: Payer: Self-pay | Admitting: Orthopaedic Surgery

## 2018-10-08 ENCOUNTER — Ambulatory Visit (INDEPENDENT_AMBULATORY_CARE_PROVIDER_SITE_OTHER): Payer: BLUE CROSS/BLUE SHIELD | Admitting: Orthopaedic Surgery

## 2018-10-08 DIAGNOSIS — M170 Bilateral primary osteoarthritis of knee: Secondary | ICD-10-CM

## 2018-10-08 MED ORDER — HYLAN G-F 20 48 MG/6ML IX SOSY
48.0000 mg | PREFILLED_SYRINGE | INTRA_ARTICULAR | Status: AC | PRN
  Administered 2018-10-08: 48 mg via INTRA_ARTICULAR

## 2018-10-08 MED ORDER — BUPIVACAINE HCL 0.25 % IJ SOLN
0.6600 mL | INTRAMUSCULAR | Status: AC | PRN
  Administered 2018-10-08: 12:00:00 .66 mL via INTRA_ARTICULAR

## 2018-10-08 MED ORDER — LIDOCAINE HCL 1 % IJ SOLN
3.0000 mL | INTRAMUSCULAR | Status: AC | PRN
  Administered 2018-10-08: 3 mL

## 2018-10-08 MED ORDER — LIDOCAINE HCL 1 % IJ SOLN
3.0000 mL | INTRAMUSCULAR | Status: AC | PRN
  Administered 2018-10-08: 12:00:00 3 mL

## 2018-10-08 NOTE — Progress Notes (Signed)
   Procedure Note  Patient: Harold Brown             Date of Birth: [birth year]           MRN: [id number]             Visit Date: 10/08/2018  Procedures: Visit Diagnoses:  1. Bilateral primary osteoarthritis of knee     Large Joint Inj: bilateral knee on 10/08/2018 11:36 AM Indications: pain Details: 22 G needle, anterolateral approach Medications (Right): 0.66 mL bupivacaine 0.25 %; 48 mg Hylan 48 MG/6ML; 3 mL lidocaine 1 % Medications (Left): 0.66 mL bupivacaine 0.25 %; 48 mg Hylan 48 MG/6ML; 3 mL lidocaine 1 %

## 2018-10-17 ENCOUNTER — Other Ambulatory Visit: Payer: Self-pay | Admitting: Gastroenterology

## 2018-10-18 ENCOUNTER — Other Ambulatory Visit: Payer: Self-pay | Admitting: Family Medicine

## 2018-10-29 ENCOUNTER — Ambulatory Visit: Payer: Self-pay

## 2018-10-29 ENCOUNTER — Other Ambulatory Visit: Payer: Self-pay

## 2018-10-29 ENCOUNTER — Ambulatory Visit (INDEPENDENT_AMBULATORY_CARE_PROVIDER_SITE_OTHER): Payer: BLUE CROSS/BLUE SHIELD | Admitting: Orthopaedic Surgery

## 2018-10-29 DIAGNOSIS — M170 Bilateral primary osteoarthritis of knee: Secondary | ICD-10-CM

## 2018-10-29 MED ORDER — DICLOFENAC SODIUM 1 % TD GEL: 2.0000 g | Freq: Four times a day (QID) | TRANSDERMAL | 6 refills | Status: DC

## 2018-10-29 NOTE — Progress Notes (Signed)
Office Visit Note   Patient: Harold Brown           Date of Birth: 12-Dec-1955           MRN: [id number] Visit Date: 10/29/2018              Requested by: Martinique, Betty G, MD [address] [place],  [place] [address] PCP: Martinique, Betty G, MD   Assessment & Plan: Visit Diagnoses:  1. Bilateral primary osteoarthritis of knee     Plan: The symptoms that he is reporting are consistent with osteoarthritis.  However his symptoms are more severe than what I would expect based on x-ray findings therefore we will obtain MRI of both knees to evaluate the full extent of his arthritis and to rule out structural abnormalities.  Voltaren gel was refilled today.  Follow-Up Instructions: Return in about 2 weeks (around 11/12/2018).   Orders:  Orders Placed This Encounter  Procedures  . XR KNEE 3 VIEW LEFT  . XR KNEE 3 VIEW RIGHT  . MR Knee Right w/o contrast  . MR Knee Left w/o contrast   Meds ordered this encounter  Medications  . diclofenac sodium (VOLTAREN) 1 % GEL    Sig: Apply 2 g topically 4 (four) times daily.    Dispense:  350 g    Refill:  6      Procedures: No procedures performed   Clinical Data: No additional findings.   Subjective: Chief Complaint  Patient presents with  . Left Knee - Pain  . Right Knee - Pain    Harold Brown returns today for recurrent and continued bilateral knee pain.  He denies any mechanical symptoms he states that he has gotten minimal relief from recent injections.  No changes in medical history.   Review of Systems   Objective: Vital Signs: There were no vitals taken for this visit.  Physical Exam  Ortho Exam Bilateral knee exams are unchanged. Specialty Comments:  No specialty comments available.  Imaging: Xr Knee 3 View Left  Result Date: 10/29/2018 Mild to moderate bilateral osteoarthritis  Xr Knee 3 View Right  Result Date: 10/29/2018 Mild to moderate bilateral osteoarthritis    PMFS History: Patient  Active Problem List   Diagnosis Date Noted  . Hyperlipidemia, mixed 03/06/2018  . Periumbilical pain 123456  . History of colonic polyps 01/27/2018  . Cystic acne vulgaris 11/03/2017  . Insomnia 11/03/2017  . GAD (generalized anxiety disorder) 11/03/2017  . Hypertension, essential, benign 11/03/2017  . Bilateral primary osteoarthritis of knee 09/11/2017   Past Medical History:  Diagnosis Date  . Allergy   . Anxiety   . Arthritis   . GERD (gastroesophageal reflux disease)   . HLD (hyperlipidemia)   . Hypertension   . Sleep apnea   . Sleep apnea with use of continuous positive airway pressure (CPAP)     Family History  Problem Relation Age of Onset  . Arthritis Mother   . Breast cancer Mother   . Diabetes Father   . Heart disease Father   . Colon cancer Neg Hx   . Esophageal cancer Neg Hx   . Inflammatory bowel disease Neg Hx   . Liver disease Neg Hx   . Pancreatic cancer Neg Hx   . Rectal cancer Neg Hx   . Stomach cancer Neg Hx     Past Surgical History:  Procedure Laterality Date  . BICEPS TENDON REPAIR Right 11/2015   upper  . broken finger Right  Bone fusion from the broken finger  . ROTATOR CUFF REPAIR Right 2013, 2017   Social History   Occupational History  . Not on file  Tobacco Use  . Smoking status: Never Smoker  . Smokeless tobacco: Never Used  Substance and Sexual Activity  . Alcohol use: Yes    Comment: 2 per day  . Drug use: Never  . Sexual activity: Yes

## 2018-11-01 ENCOUNTER — Other Ambulatory Visit: Payer: Self-pay | Admitting: Family Medicine

## 2018-11-01 DIAGNOSIS — I10 Essential (primary) hypertension: Secondary | ICD-10-CM

## 2018-11-24 ENCOUNTER — Ambulatory Visit
Admission: RE | Admit: 2018-11-24 | Discharge: 2018-11-24 | Disposition: A | Discharge status: Home / Self Care | Payer: BLUE CROSS/BLUE SHIELD | Source: Ambulatory Visit | Attending: Orthopaedic Surgery | Admitting: Orthopaedic Surgery

## 2018-11-24 ENCOUNTER — Other Ambulatory Visit: Payer: Self-pay

## 2018-11-24 DIAGNOSIS — M25561 Pain in right knee: Secondary | ICD-10-CM | POA: Diagnosis not present

## 2018-11-24 DIAGNOSIS — M25562 Pain in left knee: Secondary | ICD-10-CM | POA: Diagnosis not present

## 2018-11-24 DIAGNOSIS — M170 Bilateral primary osteoarthritis of knee: Secondary | ICD-10-CM

## 2018-11-26 ENCOUNTER — Other Ambulatory Visit: Payer: Self-pay | Admitting: Family Medicine

## 2018-11-26 DIAGNOSIS — L219 Seborrheic dermatitis, unspecified: Secondary | ICD-10-CM

## 2018-11-27 ENCOUNTER — Encounter: Payer: Self-pay | Admitting: Orthopaedic Surgery

## 2018-11-27 ENCOUNTER — Ambulatory Visit: Payer: BLUE CROSS/BLUE SHIELD | Admitting: Orthopaedic Surgery

## 2018-11-27 ENCOUNTER — Other Ambulatory Visit: Payer: Self-pay

## 2018-11-27 VITALS — Ht 72.0 in | Wt 175.0 lb

## 2018-11-27 DIAGNOSIS — S83241A Other tear of medial meniscus, current injury, right knee, initial encounter: Secondary | ICD-10-CM

## 2018-11-27 DIAGNOSIS — S83242A Other tear of medial meniscus, current injury, left knee, initial encounter: Secondary | ICD-10-CM | POA: Diagnosis not present

## 2018-11-27 NOTE — Progress Notes (Addendum)
Office Visit Note   Patient: Harold Brown           Date of Birth: 1955/09/10           MRN: [id number] Visit Date: 11/27/2018              Requested by: Martinique, Betty G, MD [address] [place],  [place] [address] PCP: Martinique, Betty G, MD   Assessment & Plan: Visit Diagnoses:  1. Acute medial meniscus tear of left knee, initial encounter   2. Acute medial meniscus tear of right knee, initial encounter     Plan: MRI of the right knee shows a complex tear of the posterior horn near the meniscal root.  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  MRI of the left knee reveals a complex tear the posterior horn the medial meniscus as well.  He has significant chondromalacia of the patellofemoral and lateral compartment.  These findings were reviewed with the patient in detail and based on discussion today patient would like to proceed with left knee partial medial meniscectomy and debridement as indicated.  Risk benefits alternatives were reviewed with the patient.  We will schedule the patient for the near future.  In terms of the right knee we will scope this sequentially after he has recovered from his left knee scope.   He has tried cortisone injections without relief and he has continued to do home exercises that I have prescribed him without relief.   Follow-Up Instructions: Return for 1 week postop visit.   Orders:  No orders of the defined types were placed in this encounter.  No orders of the defined types were placed in this encounter.     Procedures: No procedures performed   Clinical Data: No additional findings.   Subjective: Chief Complaint  Patient presents with  . Right Knee - Follow-up    MRI results bilateral knee  . Left Knee - Follow-up    [name] returns today for review of bilateral knee MRI.  No changes in medical history.   Review of Systems   Objective: Vital Signs: Ht 6' (1.829 m)   Wt 175 lb  (79.4 kg)   BMI 23.73 kg/m   Physical Exam  Ortho Exam Left knee exam shows medial joint line tenderness.  No joint effusion.  Right knee exam shows medial joint tenderness.  Small joint effusion. Specialty Comments:  No specialty comments available.  Imaging: No results found.   PMFS History: Patient Active Problem List   Diagnosis Date Noted  . Acute medial meniscus tear of right knee 11/27/2018  . Acute medial meniscus tear of left knee 11/27/2018  . Hyperlipidemia, mixed 03/06/2018  . Periumbilical pain 123456  . History of colonic polyps 01/27/2018  . Cystic acne vulgaris 11/03/2017  . Insomnia 11/03/2017  . GAD (generalized anxiety disorder) 11/03/2017  . Hypertension, essential, benign 11/03/2017  . Bilateral primary osteoarthritis of knee 09/11/2017   Past Medical History:  Diagnosis Date  . Allergy   . Anxiety   . Arthritis   . GERD (gastroesophageal reflux disease)   . HLD (hyperlipidemia)   . Hypertension   . Sleep apnea   . Sleep apnea with use of continuous positive airway pressure (CPAP)     Family History  Problem Relation Age of Onset  . Arthritis Mother   . Breast cancer Mother   . Diabetes Father   . Heart disease Father   . Colon cancer Neg Hx   .  Esophageal cancer Neg Hx   . Inflammatory bowel disease Neg Hx   . Liver disease Neg Hx   . Pancreatic cancer Neg Hx   . Rectal cancer Neg Hx   . Stomach cancer Neg Hx     Past Surgical History:  Procedure Laterality Date  . BICEPS TENDON REPAIR Right 11/2015   upper  . broken finger Right    Bone fusion from the broken finger  . ROTATOR CUFF REPAIR Right 2013, 2017   Social History   Occupational History  . Not on file  Tobacco Use  . Smoking status: Never Smoker  . Smokeless tobacco: Never Used  Substance and Sexual Activity  . Alcohol use: Yes    Comment: 2 per day  . Drug use: Never  . Sexual activity: Yes

## 2018-11-30 ENCOUNTER — Other Ambulatory Visit: Payer: Self-pay | Admitting: Family Medicine

## 2018-11-30 DIAGNOSIS — I10 Essential (primary) hypertension: Secondary | ICD-10-CM

## 2018-11-30 NOTE — Telephone Encounter (Signed)
Xanax last filled 08/05/2018 Amlodipine is a different strength in the patients chart. Please advise.

## 2018-12-02 ENCOUNTER — Encounter (HOSPITAL_BASED_OUTPATIENT_CLINIC_OR_DEPARTMENT_OTHER): Payer: Self-pay | Admitting: *Deleted

## 2018-12-02 ENCOUNTER — Other Ambulatory Visit: Payer: Self-pay

## 2018-12-03 ENCOUNTER — Ambulatory Visit: Payer: BLUE CROSS/BLUE SHIELD | Admitting: Gastroenterology

## 2018-12-03 ENCOUNTER — Encounter: Payer: Self-pay | Admitting: Gastroenterology

## 2018-12-03 VITALS — BP 138/80 | HR 59 | Temp 98.4°F | Ht 72.0 in | Wt 182.0 lb

## 2018-12-03 DIAGNOSIS — Z86010 Personal history of colonic polyps: Secondary | ICD-10-CM

## 2018-12-03 DIAGNOSIS — K2100 Gastro-esophageal reflux disease with esophagitis, without bleeding: Secondary | ICD-10-CM

## 2018-12-03 NOTE — Progress Notes (Signed)
[facility] VISIT   Primary Care Provider Martinique, Betty G, MD [place] [address] [phone]  Patient Profile: Harold Brown is a 63 y.o. male with a pmh significant for HTN, HLD, OSA, OA, Anxiety, Colon Polyps (TAs), GERD with prior esophagitis.  The patient presents to the [facility] for an evaluation and management of problem(s) noted below:  Problem List 1. Gastroesophageal reflux disease with esophagitis without hemorrhage   2. History of colonic polyps     History of Present Illness Please see initial consultation note for full details of HPI.    Interval History The patient is doing well on once daily dosing of PPI.  If he were to forget taking his medication he may have symptoms but he is relatively good with maintaining his medication.  He does note that if he tries to take it on empty stomach that it causing more issues.  As such, he takes it closer to lunchtime right with a meal right after.  He denies any significant dysphagia or odynophagia.  Weight is stable.  He denies any nausea or vomiting and does not have any history of hematemesis or coffee-ground emesis.  No significant abdominal pain.  Bowels are normal.  No blood in the stools per his report.  He is in the process of getting ready for a surgery to his knee to 8 arthritis.  GI Review of Systems Positive as above Negative for anorexia, decreased appetite, bloating, change in bowel habits  Review of Systems General: Denies fevers/chills HEENT: Denies oral lesions Cardiovascular: Denies chest pain Pulmonary: Denies shortness of breath Gastroenterological: See HPI Genitourinary: Denies darkened urine Hematological: Denies easy bruising/bleeding Dermatological: Denies jaundice Psychological: Mood is stable   Medications Current Outpatient Medications  Medication Sig Dispense Refill  . ALPRAZolam (XANAX) 0.5 MG tablet TAKE 1 TABLET  BY MOUTH TWICE A DAY AS NEEDED FOR ANXIETY 50 tablet 3  . amLODipine-olmesartan (AZOR) 5-40 MG tablet TAKE 1 TABLET BY MOUTH EVERY DAY 90 tablet 1  . Ascorbic Acid (VITAMIN C ADULT GUMMIES PO) Take by mouth 2 (two) times daily.    . Betamethasone Valerate 0.12 % foam APPLY 1 APPLICATION TOPICALLY 2 (TWO) TIMES DAILY AS NEEDED. 100 g 1  . diclofenac sodium (VOLTAREN) 1 % GEL Apply 2 g topically 4 (four) times daily. 350 g 6  . ketoconazole (NIZORAL) 2 % shampoo APPLY 1 APPLICATION TOPICALLY 2 (TWO) TIMES A WEEK. 120 mL 4  . metoprolol succinate (TOPROL-XL) 100 MG 24 hr tablet TAKE 1 TABLET BY MOUTH EVERY DAY WITH OR IMMEDIATELY FOLLOWING A MEAL 90 tablet 2  . metroNIDAZOLE (METROGEL) 0.75 % gel     . Multiple Vitamins-Minerals (HM MULTIVITAMIN ADULT GUMMY PO) Take by mouth 2 (two) times daily.    Marland Kitchen omeprazole (PRILOSEC) 40 MG capsule TAKE 1 CAPSULE BY MOUTH EVERY DAY 90 capsule 1  . sertraline (ZOLOFT) 50 MG tablet Take 1 tablet (50 mg total) by mouth daily. 90 tablet 1   No current facility-administered medications for this visit.     Allergies Allergies  Allergen Reactions  . Coconut Oil Other (See Comments)    Itching and scabs in head after washing hair with it  . Erythromycin Rash    Histories Past Medical History:  Diagnosis Date  . Allergy   . Anxiety   . Arthritis   . Depression   . GERD (gastroesophageal reflux disease)   . HLD (hyperlipidemia)   . Hypertension   .  Sleep apnea   . Sleep apnea with use of continuous positive airway pressure (CPAP)    uses CPAP nightly   Past Surgical History:  Procedure Laterality Date  . BICEPS TENDON REPAIR Right 11/2015   upper  . broken finger Right    Bone fusion from the broken finger  . ROTATOR CUFF REPAIR Right 2013, 2017   Social History   Socioeconomic History  . Marital status: Married    Spouse name: Not on file  . Number of children: 0  . Years of education: Not on file  . Highest education level: Not on file   Occupational History  . Occupation: retired  Scientific laboratory technician  . Financial resource strain: Not on file  . Food insecurity    Worry: Not on file    Inability: Not on file  . Transportation needs    Medical: Not on file    Non-medical: Not on file  Tobacco Use  . Smoking status: Never Smoker  . Smokeless tobacco: Never Used  Substance and Sexual Activity  . Alcohol use: Yes    Comment: 2 per day  . Drug use: Never  . Sexual activity: Yes  Lifestyle  . Physical activity    Days per week: Not on file    Minutes per session: Not on file  . Stress: Not on file  Relationships  . Social Herbalist on phone: Not on file    Gets together: Not on file    Attends religious service: Not on file    Active member of club or organization: Not on file    Attends meetings of clubs or organizations: Not on file    Relationship status: Not on file  . Intimate partner violence    Fear of current or ex partner: Not on file    Emotionally abused: Not on file    Physically abused: Not on file    Forced sexual activity: Not on file  Other Topics Concern  . Not on file  Social History Narrative  . Not on file   Family History  Problem Relation Age of Onset  . Arthritis Mother   . Breast cancer Mother   . Diabetes Father   . Heart disease Father   . Colon cancer Neg Hx   . Esophageal cancer Neg Hx   . Inflammatory bowel disease Neg Hx   . Liver disease Neg Hx   . Pancreatic cancer Neg Hx   . Rectal cancer Neg Hx   . Stomach cancer Neg Hx    I have reviewed his medical, social, and family history in detail and updated the electronic medical record as necessary.    PHYSICAL EXAMINATION  BP 138/80   Pulse (!) 59   Temp 98.4 F (36.9 C)   Ht 6' (1.829 m)   Wt 182 lb (82.6 kg)   BMI 24.68 kg/m  Wt Readings from Last 3 Encounters:  12/03/18 182 lb (82.6 kg)  11/27/18 175 lb (79.4 kg)  09/24/18 175 lb (79.4 kg)  GEN: NAD, appears stated age, doesn't appear chronically ill  PSYCH: Cooperative, without pressured speech EYE: Conjunctivae pink, sclerae anicteric ENT: MMM CV: RR without R/Gs  RESP: CTAB posteriorly, without wheezing GI: NABS, soft, nontender/nondistended, without rebound or guarding, no HSM appreciated MSK/EXT: No LE edema SKIN: No jaundice NEURO:  Alert & Oriented x 3, no focal deficits   REVIEW OF DATA  I reviewed the following data at the time of this encounter:  GI Procedures and Studies  June 2020 EGD - No gross lesions in esophagus. Previous esophagitis has healed. - Salmon-colored mucosa. Biopsied to rule out Barrett's. - No gross lesions in the stomach. - No gross lesions in the duodenal bulb, in the first portion of the duodenum and in the second portion of the duodenum.  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 Biopsied. - LA Grade B reflux esophagitis. - Z-line irregular, 39 cm from the incisors. - No gross lesions in the stomach. Biopsied for HP. - No gross lesions in the duodenal bulb, in the first portion of the duodenum and in the second portion of the duodenum.  January 2020 colonoscopy - Non-thrombosed external hemorrhoids and non-thrombosed internal hemorrhoids found on digital rectal exam. - The examined portion of the ileum was normal. - Two 2 to 4 mm polyps in the descending colon and in the transverse colon, removed with a cold snare. Resected and retrieved. - Diverticulosis in the sigmoid colon, in the descending colon, in the transverse colon and in the ascending colon. - Normal mucosa in the entire examined colon otherwise. - Normal mucosa in the rectum. Biopsied. - Non-bleeding non-thrombosed external and internal hemorrhoids.  Laboratory Studies  Reviewed in epic  Imaging Studies  No relevant studies to review   ASSESSMENT  Mr. Zeman is a 63 y.o. male with a pmh significant for HTN, HLD, OSA, OA, Anxiety, Colon Polyps (TAs), GERD with prior  esophagitis.  The patient is seen today for evaluation and management of:  1. Gastroesophageal reflux disease with esophagitis without hemorrhage   2. History of colonic polyps    The patient is doing well.  He is hemodynamically and clinically stable.  He has done well by restart of PPI therapy and wants to remain on it at current dosing.  In the future we may be able to decrease his dosing but for now he is feeling well.  He did discuss the role of potential other interventions including endoscopic or surgical fundoplication as well as the LYNX system that can be considered for patients who want to come off of PPI therapy in the future.  For now he is okay with where things are.  We will follow up on as-needed basis.  He knows his repeat colonoscopy will be in 5 years.  He will continue taking fiber on a daily basis.  All patient questions were answered, to the best of my ability, and the patient agrees to the aforementioned plan of action with follow-up as indicated.   PLAN  Continue PPI once daily Follow-up as needed per patient symptoms Consider in future role of endoscopic/surgical fundoplication or LYNX Repeat colonoscopy in 5 years   No orders of the defined types were placed in this encounter.   New Prescriptions   No medications on file   Modified Medications   No medications on file    Planned Follow Up: No follow-ups on file.   Justice Britain, MD [facility] # [phone]

## 2018-12-03 NOTE — Patient Instructions (Signed)
If you are age 62 or older, your body mass index should be between 23-30. Your Body mass index is 24.68 kg/m. If this is out of the aforementioned range listed, please consider follow up with your Primary Care Provider.  If you are age 82 or younger, your body mass index should be between 19-25. Your Body mass index is 24.68 kg/m. If this is out of the aformentioned range listed, please consider follow up with your Primary Care Provider.   Follow up with [place] as needed.   Thank you for choosing me and [facility].  Dr. Rush Landmark

## 2018-12-04 ENCOUNTER — Encounter (HOSPITAL_BASED_OUTPATIENT_CLINIC_OR_DEPARTMENT_OTHER)
Admission: RE | Admit: 2018-12-04 | Discharge: 2018-12-04 | Disposition: A | Discharge status: Home / Self Care | Payer: BLUE CROSS/BLUE SHIELD | Source: Ambulatory Visit | Attending: Orthopaedic Surgery | Admitting: Orthopaedic Surgery

## 2018-12-04 ENCOUNTER — Other Ambulatory Visit: Payer: Self-pay

## 2018-12-04 DIAGNOSIS — K2100 Gastro-esophageal reflux disease with esophagitis, without bleeding: Secondary | ICD-10-CM | POA: Insufficient documentation

## 2018-12-04 DIAGNOSIS — Z01812 Encounter for preprocedural laboratory examination: Secondary | ICD-10-CM | POA: Insufficient documentation

## 2018-12-04 LAB — BASIC METABOLIC PANEL
Anion gap: 9 (ref 5–15)
BUN: 21 mg/dL (ref 8–23)
CO2: 25 mmol/L (ref 22–32)
Calcium: 8.9 mg/dL (ref 8.9–10.3)
Chloride: 105 mmol/L (ref 98–111)
Creatinine, Ser: 0.85 mg/dL (ref 0.61–1.24)
GFR calc Af Amer: >60 mL/min (ref >60)
GFR calc non Af Amer: >60 mL/min (ref >60)
Glucose, Bld: 97 mg/dL (ref 70–99)
Potassium: 3.5 mmol/L (ref 3.5–5.1)
Sodium: 139 mmol/L (ref 135–145)

## 2018-12-04 NOTE — Progress Notes (Signed)

## 2018-12-05 ENCOUNTER — Other Ambulatory Visit (HOSPITAL_COMMUNITY)
Admission: RE | Admit: 2018-12-05 | Discharge: 2018-12-05 | Disposition: A | Discharge status: Home / Self Care | Payer: BLUE CROSS/BLUE SHIELD | Source: Ambulatory Visit | Attending: Orthopaedic Surgery | Admitting: Orthopaedic Surgery

## 2018-12-05 DIAGNOSIS — Z20828 Contact with and (suspected) exposure to other viral communicable diseases: Secondary | ICD-10-CM | POA: Diagnosis not present

## 2018-12-05 DIAGNOSIS — Z01812 Encounter for preprocedural laboratory examination: Secondary | ICD-10-CM | POA: Insufficient documentation

## 2018-12-07 LAB — NOVEL CORONAVIRUS, NAA (HOSP ORDER, SEND-OUT TO REF LAB; TAT 18-24 HRS): SARS-CoV-2, NAA: NOT DETECTED

## 2018-12-08 ENCOUNTER — Encounter (HOSPITAL_COMMUNITY): Payer: Self-pay | Admitting: Certified Registered"

## 2018-12-09 ENCOUNTER — Ambulatory Visit (HOSPITAL_BASED_OUTPATIENT_CLINIC_OR_DEPARTMENT_OTHER)
Admission: RE | Admit: 2018-12-09 | Payer: BLUE CROSS/BLUE SHIELD | Source: Home / Self Care | Admitting: Orthopaedic Surgery

## 2018-12-09 HISTORY — DX: Depression, unspecified: F32.A

## 2018-12-09 SURGERY — ARTHROSCOPY, KNEE, WITH MEDIAL MENISCECTOMY
Anesthesia: General | Site: Knee | Laterality: Left

## 2018-12-16 ENCOUNTER — Inpatient Hospital Stay: Payer: BLUE CROSS/BLUE SHIELD | Admitting: Orthopaedic Surgery

## 2018-12-25 ENCOUNTER — Other Ambulatory Visit: Payer: Self-pay

## 2018-12-28 ENCOUNTER — Other Ambulatory Visit: Payer: Self-pay

## 2018-12-28 ENCOUNTER — Encounter (HOSPITAL_BASED_OUTPATIENT_CLINIC_OR_DEPARTMENT_OTHER): Payer: Self-pay | Admitting: Orthopaedic Surgery

## 2018-12-29 ENCOUNTER — Other Ambulatory Visit: Payer: Self-pay | Admitting: Family Medicine

## 2018-12-29 ENCOUNTER — Other Ambulatory Visit (HOSPITAL_COMMUNITY)
Admission: RE | Admit: 2018-12-29 | Discharge: 2018-12-29 | Disposition: A | Discharge status: Home / Self Care | Payer: BLUE CROSS/BLUE SHIELD | Source: Ambulatory Visit | Attending: Orthopaedic Surgery | Admitting: Orthopaedic Surgery

## 2018-12-29 DIAGNOSIS — Z20828 Contact with and (suspected) exposure to other viral communicable diseases: Secondary | ICD-10-CM | POA: Diagnosis not present

## 2018-12-29 DIAGNOSIS — L219 Seborrheic dermatitis, unspecified: Secondary | ICD-10-CM

## 2018-12-29 DIAGNOSIS — Z01812 Encounter for preprocedural laboratory examination: Secondary | ICD-10-CM | POA: Insufficient documentation

## 2018-12-29 NOTE — Progress Notes (Signed)
To drink ensure drink by 0730 DOS

## 2018-12-30 LAB — NOVEL CORONAVIRUS, NAA (HOSP ORDER, SEND-OUT TO REF LAB; TAT 18-24 HRS): SARS-CoV-2, NAA: NOT DETECTED

## 2019-01-01 ENCOUNTER — Encounter: Payer: Self-pay | Admitting: Orthopaedic Surgery

## 2019-01-01 ENCOUNTER — Ambulatory Visit (HOSPITAL_BASED_OUTPATIENT_CLINIC_OR_DEPARTMENT_OTHER): Payer: BLUE CROSS/BLUE SHIELD | Admitting: Certified Registered Nurse Anesthetist

## 2019-01-01 ENCOUNTER — Ambulatory Visit (HOSPITAL_BASED_OUTPATIENT_CLINIC_OR_DEPARTMENT_OTHER)
Admission: RE | Admit: 2019-01-01 | Discharge: 2019-01-01 | Disposition: A | Discharge status: Home / Self Care | Payer: BLUE CROSS/BLUE SHIELD | Attending: Orthopaedic Surgery | Admitting: Orthopaedic Surgery

## 2019-01-01 ENCOUNTER — Other Ambulatory Visit: Payer: Self-pay

## 2019-01-01 ENCOUNTER — Encounter (HOSPITAL_BASED_OUTPATIENT_CLINIC_OR_DEPARTMENT_OTHER): Payer: Self-pay | Admitting: Orthopaedic Surgery

## 2019-01-01 ENCOUNTER — Encounter (HOSPITAL_BASED_OUTPATIENT_CLINIC_OR_DEPARTMENT_OTHER)
Admission: RE | Disposition: A | Discharge status: Home / Self Care | Payer: Self-pay | Source: Home / Self Care | Attending: Orthopaedic Surgery

## 2019-01-01 DIAGNOSIS — S83222A Peripheral tear of medial meniscus, current injury, left knee, initial encounter: Secondary | ICD-10-CM | POA: Diagnosis not present

## 2019-01-01 DIAGNOSIS — Z803 Family history of malignant neoplasm of breast: Secondary | ICD-10-CM | POA: Diagnosis not present

## 2019-01-01 DIAGNOSIS — I10 Essential (primary) hypertension: Secondary | ICD-10-CM | POA: Diagnosis not present

## 2019-01-01 DIAGNOSIS — S83242A Other tear of medial meniscus, current injury, left knee, initial encounter: Secondary | ICD-10-CM | POA: Diagnosis not present

## 2019-01-01 DIAGNOSIS — K219 Gastro-esophageal reflux disease without esophagitis: Secondary | ICD-10-CM | POA: Insufficient documentation

## 2019-01-01 DIAGNOSIS — X58XXXA Exposure to other specified factors, initial encounter: Secondary | ICD-10-CM | POA: Insufficient documentation

## 2019-01-01 DIAGNOSIS — Z881 Allergy status to other antibiotic agents status: Secondary | ICD-10-CM | POA: Diagnosis not present

## 2019-01-01 DIAGNOSIS — Z8249 Family history of ischemic heart disease and other diseases of the circulatory system: Secondary | ICD-10-CM | POA: Insufficient documentation

## 2019-01-01 DIAGNOSIS — G4730 Sleep apnea, unspecified: Secondary | ICD-10-CM | POA: Insufficient documentation

## 2019-01-01 DIAGNOSIS — Z833 Family history of diabetes mellitus: Secondary | ICD-10-CM | POA: Insufficient documentation

## 2019-01-01 DIAGNOSIS — Z791 Long term (current) use of non-steroidal anti-inflammatories (NSAID): Secondary | ICD-10-CM | POA: Diagnosis not present

## 2019-01-01 DIAGNOSIS — Z79899 Other long term (current) drug therapy: Secondary | ICD-10-CM | POA: Diagnosis not present

## 2019-01-01 DIAGNOSIS — M659 Synovitis and tenosynovitis, unspecified: Secondary | ICD-10-CM | POA: Diagnosis not present

## 2019-01-01 DIAGNOSIS — E785 Hyperlipidemia, unspecified: Secondary | ICD-10-CM | POA: Diagnosis not present

## 2019-01-01 DIAGNOSIS — F329 Major depressive disorder, single episode, unspecified: Secondary | ICD-10-CM | POA: Diagnosis not present

## 2019-01-01 DIAGNOSIS — Z91018 Allergy to other foods: Secondary | ICD-10-CM | POA: Insufficient documentation

## 2019-01-01 DIAGNOSIS — M1990 Unspecified osteoarthritis, unspecified site: Secondary | ICD-10-CM | POA: Insufficient documentation

## 2019-01-01 DIAGNOSIS — S83282A Other tear of lateral meniscus, current injury, left knee, initial encounter: Secondary | ICD-10-CM | POA: Diagnosis not present

## 2019-01-01 DIAGNOSIS — F419 Anxiety disorder, unspecified: Secondary | ICD-10-CM | POA: Insufficient documentation

## 2019-01-01 DIAGNOSIS — M94262 Chondromalacia, left knee: Secondary | ICD-10-CM | POA: Diagnosis not present

## 2019-01-01 DIAGNOSIS — Z8261 Family history of arthritis: Secondary | ICD-10-CM | POA: Diagnosis not present

## 2019-01-01 HISTORY — PX: KNEE ARTHROSCOPY WITH MEDIAL MENISECTOMY: SHX5651

## 2019-01-01 SURGERY — ARTHROSCOPY, KNEE, WITH MEDIAL MENISCECTOMY
Anesthesia: General | Site: Knee | Laterality: Left

## 2019-01-01 MED ORDER — PROPOFOL 10 MG/ML IV BOLUS
INTRAVENOUS | Status: AC
  Filled 2019-01-01: qty 20

## 2019-01-01 MED ORDER — CEFAZOLIN SODIUM-DEXTROSE 2-4 GM/100ML-% IV SOLN
2.0000 g | INTRAVENOUS | Status: AC
  Administered 2019-01-01: 2 g via INTRAVENOUS

## 2019-01-01 MED ORDER — ONDANSETRON HCL 4 MG/2ML IJ SOLN
INTRAMUSCULAR | Status: DC | PRN
  Administered 2019-01-01: 4 mg via INTRAVENOUS

## 2019-01-01 MED ORDER — MEPERIDINE HCL 25 MG/ML IJ SOLN: 6.2500 mg | INTRAMUSCULAR | Status: DC | PRN

## 2019-01-01 MED ORDER — MIDAZOLAM HCL 2 MG/2ML IJ SOLN: 1.0000 mg | INTRAMUSCULAR | Status: DC | PRN

## 2019-01-01 MED ORDER — FENTANYL CITRATE (PF) 100 MCG/2ML IJ SOLN: 50.0000 ug | INTRAMUSCULAR | Status: DC | PRN

## 2019-01-01 MED ORDER — ONDANSETRON HCL 4 MG/2ML IJ SOLN: 4.0000 mg | Freq: Once | INTRAMUSCULAR | Status: DC | PRN

## 2019-01-01 MED ORDER — FENTANYL CITRATE (PF) 100 MCG/2ML IJ SOLN
INTRAMUSCULAR | Status: DC | PRN
  Administered 2019-01-01: 50 ug via INTRAVENOUS
  Administered 2019-01-01 (×2): 25 ug via INTRAVENOUS
  Administered 2019-01-01 (×2): 50 ug via INTRAVENOUS

## 2019-01-01 MED ORDER — FENTANYL CITRATE (PF) 100 MCG/2ML IJ SOLN
INTRAMUSCULAR | Status: AC
  Filled 2019-01-01: qty 2

## 2019-01-01 MED ORDER — KETOROLAC TROMETHAMINE 30 MG/ML IJ SOLN
INTRAMUSCULAR | Status: DC | PRN
  Administered 2019-01-01: 30 mg via INTRAVENOUS

## 2019-01-01 MED ORDER — FENTANYL CITRATE (PF) 100 MCG/2ML IJ SOLN
25.0000 ug | INTRAMUSCULAR | Status: DC | PRN
  Administered 2019-01-01: 50 ug via INTRAVENOUS

## 2019-01-01 MED ORDER — LIDOCAINE 2% (20 MG/ML) 5 ML SYRINGE
INTRAMUSCULAR | Status: DC | PRN
  Administered 2019-01-01: 100 mg via INTRAVENOUS

## 2019-01-01 MED ORDER — OXYCODONE-ACETAMINOPHEN 5-325 MG PO TABS: 1.0000 | ORAL_TABLET | Freq: Two times a day (BID) | ORAL | 0 refills | Status: DC | PRN

## 2019-01-01 MED ORDER — SODIUM CHLORIDE 0.9 % IR SOLN
Status: DC | PRN
  Administered 2019-01-01: 3000 mL
  Administered 2019-01-01: 6000 mL

## 2019-01-01 MED ORDER — DEXAMETHASONE SODIUM PHOSPHATE 10 MG/ML IJ SOLN
INTRAMUSCULAR | Status: DC | PRN
  Administered 2019-01-01: 10 mg via INTRAVENOUS

## 2019-01-01 MED ORDER — MIDAZOLAM HCL 2 MG/2ML IJ SOLN
INTRAMUSCULAR | Status: AC
  Filled 2019-01-01: qty 2

## 2019-01-01 MED ORDER — OXYCODONE HCL 5 MG PO TABS
ORAL_TABLET | ORAL | Status: AC
  Filled 2019-01-01: qty 1

## 2019-01-01 MED ORDER — CHLORHEXIDINE GLUCONATE 4 % EX LIQD: 60.0000 mL | Freq: Once | CUTANEOUS | Status: DC

## 2019-01-01 MED ORDER — GLYCOPYRROLATE PF 0.2 MG/ML IJ SOSY
PREFILLED_SYRINGE | INTRAMUSCULAR | Status: AC
  Filled 2019-01-01: qty 1

## 2019-01-01 MED ORDER — LACTATED RINGERS IV SOLN: INTRAVENOUS | Status: DC

## 2019-01-01 MED ORDER — DEXMEDETOMIDINE HCL IN NACL 200 MCG/50ML IV SOLN
INTRAVENOUS | Status: DC | PRN
  Administered 2019-01-01 (×4): 8 ug via INTRAVENOUS

## 2019-01-01 MED ORDER — CEFAZOLIN SODIUM-DEXTROSE 2-4 GM/100ML-% IV SOLN
INTRAVENOUS | Status: AC
  Filled 2019-01-01: qty 100

## 2019-01-01 MED ORDER — EPHEDRINE 5 MG/ML INJ
INTRAVENOUS | Status: AC
  Filled 2019-01-01: qty 10

## 2019-01-01 MED ORDER — BUPIVACAINE HCL (PF) 0.25 % IJ SOLN
INTRAMUSCULAR | Status: DC | PRN
  Administered 2019-01-01: 20 mL

## 2019-01-01 MED ORDER — PROPOFOL 500 MG/50ML IV EMUL
INTRAVENOUS | Status: AC
  Filled 2019-01-01: qty 50

## 2019-01-01 MED ORDER — OXYCODONE HCL 5 MG/5ML PO SOLN: 5.0000 mg | Freq: Once | ORAL | Status: AC | PRN

## 2019-01-01 MED ORDER — BUPIVACAINE HCL (PF) 0.25 % IJ SOLN
INTRAMUSCULAR | Status: AC
  Filled 2019-01-01: qty 30

## 2019-01-01 MED ORDER — OXYCODONE HCL 5 MG PO TABS
5.0000 mg | ORAL_TABLET | Freq: Once | ORAL | Status: AC | PRN
  Administered 2019-01-01: 5 mg via ORAL

## 2019-01-01 MED ORDER — LIDOCAINE 2% (20 MG/ML) 5 ML SYRINGE
INTRAMUSCULAR | Status: AC
  Filled 2019-01-01: qty 5

## 2019-01-01 MED ORDER — KETOROLAC TROMETHAMINE 30 MG/ML IJ SOLN
INTRAMUSCULAR | Status: AC
  Filled 2019-01-01: qty 1

## 2019-01-01 MED ORDER — DEXMEDETOMIDINE HCL IN NACL 200 MCG/50ML IV SOLN
INTRAVENOUS | Status: AC
  Filled 2019-01-01: qty 50

## 2019-01-01 MED ORDER — KETOROLAC TROMETHAMINE 10 MG PO TABS: 10.0000 mg | ORAL_TABLET | Freq: Two times a day (BID) | ORAL | 0 refills | Status: DC | PRN

## 2019-01-01 MED ORDER — ONDANSETRON HCL 4 MG/2ML IJ SOLN
INTRAMUSCULAR | Status: AC
  Filled 2019-01-01: qty 2

## 2019-01-01 MED ORDER — MIDAZOLAM HCL 5 MG/5ML IJ SOLN
INTRAMUSCULAR | Status: DC | PRN
  Administered 2019-01-01: 2 mg via INTRAVENOUS

## 2019-01-01 MED ORDER — DEXAMETHASONE SODIUM PHOSPHATE 10 MG/ML IJ SOLN
INTRAMUSCULAR | Status: AC
  Filled 2019-01-01: qty 1

## 2019-01-01 MED ORDER — PROPOFOL 10 MG/ML IV BOLUS
INTRAVENOUS | Status: DC | PRN
  Administered 2019-01-01: 200 mg via INTRAVENOUS
  Administered 2019-01-01: 80 mg via INTRAVENOUS

## 2019-01-01 SURGICAL SUPPLY — 33 items
BANDAGE ESMARK 6X9 LF (GAUZE/BANDAGES/DRESSINGS) IMPLANT
BLADE SHAVER TORPEDO 4X13 (MISCELLANEOUS) IMPLANT
BNDG ELASTIC 6X5.8 VLCR STR LF (GAUZE/BANDAGES/DRESSINGS) ×6 IMPLANT
BNDG ESMARK 6X9 LF (GAUZE/BANDAGES/DRESSINGS)
COVER WAND RF STERILE (DRAPES) IMPLANT
CUFF TOURN SGL QUICK 34 (TOURNIQUET CUFF) ×2
CUFF TRNQT CYL 34X4.125X (TOURNIQUET CUFF) ×1 IMPLANT
DRAPE ARTHROSCOPY W/POUCH 90 (DRAPES) ×3 IMPLANT
DRAPE IMP U-DRAPE 54X76 (DRAPES) ×3 IMPLANT
DRAPE U-SHAPE 47X51 STRL (DRAPES) ×3 IMPLANT
DURAPREP 26ML APPLICATOR (WOUND CARE) ×3 IMPLANT
GAUZE SPONGE 4X4 12PLY STRL (GAUZE/BANDAGES/DRESSINGS) ×3 IMPLANT
GAUZE XEROFORM 1X8 LF (GAUZE/BANDAGES/DRESSINGS) ×3 IMPLANT
GLOVE BIOGEL PI IND STRL 7.0 (GLOVE) ×1 IMPLANT
GLOVE BIOGEL PI INDICATOR 7.0 (GLOVE) ×2
GLOVE ECLIPSE 7.0 STRL STRAW (GLOVE) ×3 IMPLANT
GLOVE SKINSENSE NS SZ7.5 (GLOVE) ×2
GLOVE SKINSENSE STRL SZ7.5 (GLOVE) ×1 IMPLANT
GLOVE SURG SYN 7.5  E (GLOVE) ×2
GLOVE SURG SYN 7.5 E (GLOVE) ×1 IMPLANT
GLOVE SURG SYN 7.5 PF PI (GLOVE) ×1 IMPLANT
GOWN STRL REIN XL XLG (GOWN DISPOSABLE) ×3 IMPLANT
GOWN STRL REUS W/ TWL LRG LVL3 (GOWN DISPOSABLE) ×1 IMPLANT
GOWN STRL REUS W/ TWL XL LVL3 (GOWN DISPOSABLE) ×1 IMPLANT
GOWN STRL REUS W/TWL LRG LVL3 (GOWN DISPOSABLE) ×2
GOWN STRL REUS W/TWL XL LVL3 (GOWN DISPOSABLE) ×2
KNEE WRAP E Z 3 GEL PACK (MISCELLANEOUS) ×3 IMPLANT
MANIFOLD NEPTUNE II (INSTRUMENTS) ×3 IMPLANT
PACK ARTHROSCOPY DSU (CUSTOM PROCEDURE TRAY) ×3 IMPLANT
PACK BASIN DAY SURGERY FS (CUSTOM PROCEDURE TRAY) ×3 IMPLANT
SUT ETHILON 3 0 PS 1 (SUTURE) ×3 IMPLANT
TOWEL GREEN STERILE FF (TOWEL DISPOSABLE) ×3 IMPLANT
TUBING ARTHROSCOPY IRRIG 16FT (MISCELLANEOUS) ×3 IMPLANT

## 2019-01-01 NOTE — Discharge Instructions (Signed)
  Post-operative patient instructions  Knee Arthroscopy   . Ice:  Place intermittent ice or cooler pack over your knee, 30 minutes on and 30 minutes off.  Continue this for the first 72 hours after surgery, then save ice for use after therapy sessions or on more active days.   . Weight:  You may bear weight on your leg as your symptoms allow. . Crutches:  Use crutches (or walker) to assist in walking until told to discontinue by your physical therapist or physician. This will help to reduce pain. . Strengthening:  Perform simple thigh squeezes (isometric quad contractions) and straight leg lifts as you are able (3 sets of 5 to 10 repetitions, 3 times a day).  For the leg lifts, have someone support under your ankle in the beginning until you have increased strength enough to do this on your own.  To help get started on thigh squeezes, place a pillow under your knee and push down on the pillow with back of knee (sometimes easier to do than with your leg fully straight). . Motion:  Perform gentle knee motion as tolerated - this is gentle bending and straightening of the knee. Seated heel slides: you can start by sitting in a chair, remove your brace, and gently slide your heel back on the floor - allowing your knee to bend. Have someone help you straighten your knee (or use your other leg/foot hooked under your ankle.  . Dressing:  Perform 1st dressing change at 2 days postoperative. A moderate amount of blood tinged drainage is to be expected.  So if you bleed through the dressing on the first or second day or if you have fevers, it is fine to change the dressing/check the wounds early and redress wound. Elevate your leg.  If it bleeds through again, or if the incisions are leaking frank blood, please call the office. May change dressing every 1-2 days thereafter to help watch wounds. Can purchase Tegaderm (or 3M Nexcare) water resistant dressings at local pharmacy / [facility]. . Shower:  Light shower is  ok after 2 days.  Please take shower, NO bath. Recover with gauze and ace wrap to help keep wounds protected.   . Pain medication:  A narcotic pain medication has been prescribed.  Take as directed.  Typically you need narcotic pain medication more regularly during the first 3 to 5 days after surgery.  Decrease your use of the medication as the pain improves.  Narcotics can sometimes cause constipation, even after a few doses.  If you have problems with constipation, you can take an over the counter stool softener or light laxative.  If you have persistent problems, please notify your physician's office. . Physical therapy: Additional activity guidelines to be provided by your physician or physical therapist at follow-up visits.  . Driving: Do not recommend driving x 2 weeks post surgical, especially if surgery performed on right side. Should not drive while taking narcotic pain medications. It typically takes at least 2 weeks to restore sufficient neuromuscular function for normal reaction times for driving safety.  . Call [phone] for questions or problems. Evenings you will be forwarded to the hospital operator.  Ask for the orthopaedic physician on call. Please call if you experience:    o Redness, foul smelling, or persistent drainage from the surgical site  o worsening knee pain and swelling not responsive to medication  o any calf pain and or swelling of the lower leg  o temperatures greater than   101.5 F o other questions or concerns   Thank you for allowing [place] to be a part of your care.    No Ibuprofen until 8:30 PM on 01/01/2019.     Post Anesthesia Home Care Instructions  Activity: Get plenty of rest for the remainder of the day. A responsible individual must stay with you for 24 hours following the procedure.  For the next 24 hours, DO NOT: -Drive a car -Paediatric nurse -Drink alcoholic beverages -Take any medication unless instructed by your physician -Make any legal  decisions or sign important papers.  Meals: Start with liquid foods such as gelatin or soup. Progress to regular foods as tolerated. Avoid greasy, spicy, heavy foods. If nausea and/or vomiting occur, drink only clear liquids until the nausea and/or vomiting subsides. Call your physician if vomiting continues.  Special Instructions/Symptoms: Your throat may feel dry or sore from the anesthesia or the breathing tube placed in your throat during surgery. If this causes discomfort, gargle with warm salt water. The discomfort should disappear within 24 hours.  If you had a scopolamine patch placed behind your ear for the management of post- operative nausea and/or vomiting:  1. The medication in the patch is effective for 72 hours, after which it should be removed.  Wrap patch in a tissue and discard in the trash. Wash hands thoroughly with soap and water. 2. You may remove the patch earlier than 72 hours if you experience unpleasant side effects which may include dry mouth, dizziness or visual disturbances. 3. Avoid touching the patch. Wash your hands with soap and water after contact with the patch.

## 2019-01-01 NOTE — H&P (Signed)
PREOPERATIVE H&P  Chief Complaint: left knee medial meniscal tear  HPI: Harold Brown is a 63 y.o. male who presents for surgical treatment of left knee medial meniscal tear.  He denies any changes in medical history.  Past Medical History:  Diagnosis Date  . Allergy   . Anxiety   . Arthritis   . Depression   . GERD (gastroesophageal reflux disease)   . HLD (hyperlipidemia)   . Hypertension   . Sleep apnea   . Sleep apnea with use of continuous positive airway pressure (CPAP)    uses CPAP nightly   Past Surgical History:  Procedure Laterality Date  . BICEPS TENDON REPAIR Right 11/2015   upper  . broken finger Right    Bone fusion from the broken finger  . ROTATOR CUFF REPAIR Right 2013, 2017   Social History   Socioeconomic History  . Marital status: Married    Spouse name: Earley Abide  . Number of children: 0  . Years of education: Not on file  . Highest education level: Not on file  Occupational History  . Occupation: retired  Tobacco Use  . Smoking status: Never Smoker  . Smokeless tobacco: Never Used  Substance and Sexual Activity  . Alcohol use: Yes    Comment: 2-3 a day/ 2-3 times per [facility]  . Drug use: Never  . Sexual activity: Yes  Other Topics Concern  . Not on file  Social History Narrative  . Not on file   Social Determinants of Health   Financial Resource Strain:   . Difficulty of Paying Living Expenses: Not on file  Food Insecurity:   . Worried About Charity fundraiser in the Last Year: Not on file  . Ran Out of Food in the Last Year: Not on file  Transportation Needs:   . Lack of Transportation (Medical): Not on file  . Lack of Transportation (Non-Medical): Not on file  Physical Activity:   . Days of Exercise per Week: Not on file  . Minutes of Exercise per Session: Not on file  Stress:   . Feeling of Stress : Not on file  Social Connections:   . Frequency of Communication with Friends and Family: Not on file  . Frequency  of Social Gatherings with Friends and Family: Not on file  . Attends Religious Services: Not on file  . Active Member of Clubs or Organizations: Not on file  . Attends Archivist Meetings: Not on file  . Marital Status: Not on file   Family History  Problem Relation Age of Onset  . Arthritis Mother   . Breast cancer Mother   . Diabetes Father   . Heart disease Father   . Colon cancer Neg Hx   . Esophageal cancer Neg Hx   . Inflammatory bowel disease Neg Hx   . Liver disease Neg Hx   . Pancreatic cancer Neg Hx   . Rectal cancer Neg Hx   . Stomach cancer Neg Hx    Allergies  Allergen Reactions  . Coconut Oil Other (See Comments)    Itching and scabs in head after washing hair with it  . Erythromycin Rash   Prior to Admission medications   Medication Sig Start Date End Date Taking? Authorizing Provider  ALPRAZolam Duanne Moron) 0.5 MG tablet TAKE 1 TABLET BY MOUTH TWICE A DAY AS NEEDED FOR ANXIETY 12/02/18  Yes Martinique, Betty G, MD  amlodipine-olmesartan (AZOR) 10-20 MG tablet TAKE 1 TABLET BY MOUTH EVERY  DAY 12/29/18  Yes Martinique, Betty G, MD  amLODipine-olmesartan (AZOR) 5-40 MG tablet TAKE 1 TABLET BY MOUTH EVERY DAY 12/02/18  Yes Martinique, Betty G, MD  Ascorbic Acid (VITAMIN C ADULT GUMMIES PO) Take by mouth 2 (two) times daily.   Yes [provider]  Betamethasone Valerate 0.12 % foam APPLY 1 APPLICATION TOPICALLY 2 (TWO) TIMES DAILY AS NEEDED. 12/29/18  Yes Martinique, Betty G, MD  diclofenac sodium (VOLTAREN) 1 % GEL Apply 2 g topically 4 (four) times daily. 10/29/18  Yes Leandrew Koyanagi, MD  ketoconazole (NIZORAL) 2 % shampoo APPLY 1 APPLICATION TOPICALLY 2 (TWO) TIMES A WEEK. 10/01/18  Yes Martinique, Betty G, MD  metoprolol succinate (TOPROL-XL) 100 MG 24 hr tablet TAKE 1 TABLET BY MOUTH EVERY DAY WITH OR IMMEDIATELY FOLLOWING A MEAL 10/19/18  Yes Martinique, Betty G, MD  metroNIDAZOLE (METROGEL) 0.75 % gel  01/29/18  Yes [provider]  Multiple Vitamins-Minerals (HM  MULTIVITAMIN ADULT GUMMY PO) Take by mouth 2 (two) times daily.   Yes [provider]  omeprazole (PRILOSEC) 40 MG capsule TAKE 1 CAPSULE BY MOUTH EVERY DAY 10/20/18  Yes Mansouraty, Telford Nab., MD  sertraline (ZOLOFT) 50 MG tablet Take 1 tablet (50 mg total) by mouth daily. 09/05/18  Yes Martinique, Betty G, MD     Positive ROS: All other systems have been reviewed and were otherwise negative with the exception of those mentioned in the HPI and as above.  Physical Exam: General: Alert, no acute distress Cardiovascular: No pedal edema Respiratory: No cyanosis, no use of accessory musculature GI: abdomen soft Skin: No lesions in the area of chief complaint Neurologic: Sensation intact distally Psychiatric: Patient is competent for consent with normal mood and affect Lymphatic: no lymphedema  MUSCULOSKELETAL: exam stable  Assessment: left knee medial meniscal tear  Plan: Plan for Procedure(s): LEFT KNEE ARTHROSCOPY WITH PARTIAL MEDIAL MENISCECTOMY  The risks benefits and alternatives were discussed with the patient including but not limited to the risks of nonoperative treatment, versus surgical intervention including infection, bleeding, nerve injury,  blood clots, cardiopulmonary complications, morbidity, mortality, among others, and they were willing to proceed.   Eduard Roux, MD   01/01/2019 10:42 AM

## 2019-01-01 NOTE — Transfer of Care (Signed)
Immediate Anesthesia Transfer of Care Note  Patient: Film/video editor  Procedure(s) Performed: LEFT KNEE ARTHROSCOPY WITH PARTIAL MEDIAL AND LATERAL MENISCECTOMY (Left Knee)  Patient Location: PACU  Anesthesia Type:General  Level of Consciousness: drowsy and patient cooperative  Airway & Oxygen Therapy: Patient Spontanous Breathing and Patient connected to face mask oxygen  Post-op Assessment: Report given to RN and Post -op Vital signs reviewed and stable  Post vital signs: Reviewed and stable  Last Vitals:  Vitals Value Taken Time  BP 85/59   Temp    Pulse 63 01/01/19 1243  Resp 12 01/01/19 1243  SpO2 100 % 01/01/19 1243    Last Pain:  Vitals:   01/01/19 1014  TempSrc: Oral  PainSc: 0-No pain         Complications: No apparent anesthesia complications

## 2019-01-01 NOTE — Anesthesia Procedure Notes (Signed)
Procedure Name: LMA Insertion Date/Time: 01/01/2019 11:55 AM Performed by: Genelle Bal, CRNA Pre-anesthesia Checklist: Patient identified, Emergency Drugs available, Suction available and Patient being monitored Patient Re-evaluated:Patient Re-evaluated prior to induction Oxygen Delivery Method: Circle system utilized Preoxygenation: Pre-oxygenation with 100% oxygen Induction Type: IV induction Ventilation: Mask ventilation without difficulty LMA: LMA inserted LMA Size: 4.0 Number of attempts: 1 Airway Equipment and Method: Bite block Placement Confirmation: positive ETCO2 Tube secured with: Tape Dental Injury: Teeth and Oropharynx as per pre-operative assessment

## 2019-01-01 NOTE — Anesthesia Preprocedure Evaluation (Signed)
Anesthesia Evaluation  Patient identified by MRN, date of birth, ID band Patient awake    Reviewed: Allergy & Precautions, NPO status , Patient's Chart, lab work & pertinent test results, reviewed documented beta blocker date and time   Airway Mallampati: II  TM Distance: >3 FB Neck ROM: Full    Dental  (+) Teeth Intact   Pulmonary sleep apnea and Continuous Positive Airway Pressure Ventilation ,    Pulmonary exam normal breath sounds clear to auscultation       Cardiovascular hypertension, Pt. on medications and Pt. on home beta blockers Normal cardiovascular exam Rhythm:Regular Rate:Normal     Neuro/Psych PSYCHIATRIC DISORDERS Anxiety Depression negative neurological ROS     GI/Hepatic Neg liver ROS, GERD  Medicated and Controlled,  Endo/Other  Hyperlipidemia  Renal/GU negative Renal ROS  negative genitourinary   Musculoskeletal  (+) Arthritis , Osteoarthritis,  Left Medial Meniscus tear   Abdominal (+) - obese,   Peds  Hematology negative hematology ROS (+)   Anesthesia Other Findings   Reproductive/Obstetrics                             Anesthesia Physical Anesthesia Plan  ASA: II  Anesthesia Plan: General   Post-op Pain Management:    Induction: Intravenous  PONV Risk Score and Plan: 3 and Midazolam, Dexamethasone, Ondansetron and Treatment may vary due to age or medical condition  Airway Management Planned: LMA  Additional Equipment:   Intra-op Plan:   Post-operative Plan: Extubation in OR  Informed Consent: I have reviewed the patients History and Physical, chart, labs and discussed the procedure including the risks, benefits and alternatives for the proposed anesthesia with the patient or authorized representative who has indicated his/her understanding and acceptance.     Dental advisory given  Plan Discussed with: CRNA and Surgeon  Anesthesia Plan Comments:          Anesthesia Quick Evaluation

## 2019-01-01 NOTE — Anesthesia Postprocedure Evaluation (Signed)
Anesthesia Post Note  Patient: Film/video editor  Procedure(s) Performed: LEFT KNEE ARTHROSCOPY WITH PARTIAL MEDIAL AND LATERAL MENISCECTOMY (Left Knee)     Patient location during evaluation: PACU Anesthesia Type: General Level of consciousness: awake and alert and oriented Pain management: pain level controlled Vital Signs Assessment: post-procedure vital signs reviewed and stable Respiratory status: spontaneous breathing, nonlabored ventilation and respiratory function stable Cardiovascular status: blood pressure returned to baseline and stable Postop Assessment: no apparent nausea or vomiting Anesthetic complications: no    Last Vitals:  Vitals:   01/01/19 1245 01/01/19 1256  BP: (!) 89/62 105/83  Pulse: 61 70  Resp: 20 17  Temp:    SpO2: 99% 100%    Last Pain:  Vitals:   01/01/19 1245  TempSrc:   PainSc: Asleep                 Calia Napp A.

## 2019-01-01 NOTE — Op Note (Signed)
   Surgery Date: 01/01/2019  Surgeon(s): Leandrew Koyanagi, MD  ASSIST: Madalyn Rob, [place]; necessary for the timely completion of procedure and due to complexity of procedure.  ANESTHESIA:  general  FLUIDS: Per anesthesia record.   ESTIMATED BLOOD LOSS: minimal  PREOPERATIVE DIAGNOSES:  1. Left knee medial and lateral meniscus tear 2. Left knee synovitis 3. Left knee chondromalacia  POSTOPERATIVE DIAGNOSES:  same  PROCEDURES PERFORMED:  1. Left knee arthroscopy with major synovectomy 2. Left knee arthroscopy with arthroscopic partial medial and lateral meniscectomy 3. Left knee arthroscopy with arthroscopic chondroplasty medial femoral condyle  DESCRIPTION OF PROCEDURE: Mr. Mcmaken is a 64 y.o.-year-old male with left knee medial and lateral meniscus tear. Plans are to proceed with partial medial and lateral meniscectomy and diagnostic arthroscopy with debridement as indicated. Full discussion held regarding risks benefits alternatives and complications related surgical intervention. Conservative care options reviewed. All questions answered.  The patient was identified in the preoperative holding area and the operative extremity was marked. The patient was brought to the operating room and transferred to operating table in a supine position. Satisfactory general anesthesia was induced by anesthesiology.    Standard anterolateral, anteromedial arthroscopy portals were obtained. The anteromedial portal was obtained with a spinal needle for localization under direct visualization with subsequent diagnostic findings.   Incisions were made for knee arthroscopy portals.  Diagnostic knee arthroscopy along with a major synovectomy was performed in all 3 compartments with oscillating shaver.  We then positioned the arthroscope in the medial compartment and with a gentle valgus stress we dressed the medial compartment.  We found grade III chondromalacia of the medial femoral condyle  throughout its weightbearing surface.  Did find a peripheral tear that was full-thickness of the posterior horn.  A partial medial meniscectomy was performed with a meniscus basket and oscillating shaver back to a stable rim taking approximately 25% of the meniscus volume.  The cruciates were then visualized and inspected.  The knee was placed in a figure 4 position in the lateral compartment was addressed.  He had degenerative radial tears of the lateral meniscus which a partial lateral meniscectomy was performed.  The knee was then placed in extension and gentle chondroplasty was performed for the femoral trochlea.  The gutters were checked for loose bodies.  Excess fluid was removed from the knee joint.  Incisions were closed with interrupted sutures.  Sterile dressings were applied.  Patient tolerated the procedure well had no immediate complications.  Suprapatellar pouch and gutters: moderate synovitis or debris. Patella chondral surface: Grade 3 Trochlear chondral surface: Grade 3-4 Patellofemoral tracking: normal Medial meniscus: peripheral tear.  Medial femoral condyle weight bearing surface: Grade 3 Medial tibial plateau: Grade 2 Anterior cruciate ligament:stable Posterior cruciate ligament:stable Lateral meniscus: degenerative tear.   Lateral femoral condyle weight bearing surface: Grade 3 Lateral tibial plateau: Grade 2  DISPOSITION: The patient was awakened from general anesthetic, extubated, taken to the recovery room in medically stable condition, no apparent complications. The patient may be weightbearing as tolerated to the operative lower extremity.  Range of motion of right knee as tolerated.  Azucena Cecil, MD [facility] 12:32 PM

## 2019-01-04 ENCOUNTER — Encounter: Payer: Self-pay | Admitting: *Deleted

## 2019-01-12 ENCOUNTER — Other Ambulatory Visit: Payer: Self-pay

## 2019-01-12 ENCOUNTER — Encounter: Payer: Self-pay | Admitting: Physician Assistant

## 2019-01-12 ENCOUNTER — Ambulatory Visit (INDEPENDENT_AMBULATORY_CARE_PROVIDER_SITE_OTHER): Payer: BLUE CROSS/BLUE SHIELD | Admitting: Physician Assistant

## 2019-01-12 DIAGNOSIS — Z9889 Other specified postprocedural states: Secondary | ICD-10-CM

## 2019-01-12 NOTE — Progress Notes (Signed)
Post-Op Visit Note   Patient: Harold Brown           Date of Birth: [birth year]           MRN: [id number] Visit Date: 01/12/2019 PCP: Martinique, Betty G, MD   Assessment & Plan:  Chief Complaint:  Chief Complaint  Patient presents with  . Left Knee - Routine Post Op, Follow-up   Visit Diagnoses:  1. S/P left knee arthroscopy     Plan: Patient is a pleasant 63 year old gentleman presents our clinic today 11 days status post left arthroscopic debridement medial lateral meniscus as well as chondroplasty synovectomy, date of surgery 01/01/2019.  He has been doing fairly well.  Taking Advil for pain.  No fevers or chills.  Examination of left knee reveals well-healing surgical portals.  1 nylon suture has already come out of been removed.  Other is still in place.  Calf is soft and nontender.  Today, the other nylon sutures removed and Steri-Strips applied.  Range of motion exercises provided.  He will follow-up with [place] in 4 weeks time for recheck.  Call with concerns or questions.  Follow-Up Instructions: Return in about 4 weeks (around 02/09/2019).   Orders:  No orders of the defined types were placed in this encounter.  No orders of the defined types were placed in this encounter.   Imaging: No new imaging  PMFS History: Patient Active Problem List   Diagnosis Date Noted  . Gastroesophageal reflux disease with esophagitis without hemorrhage 12/04/2018  . Acute medial meniscus tear of right knee 11/27/2018  . Acute medial meniscus tear of left knee 11/27/2018  . Hyperlipidemia, mixed 03/06/2018  . Periumbilical pain 123456  . History of colonic polyps 01/27/2018  . Cystic acne vulgaris 11/03/2017  . Insomnia 11/03/2017  . GAD (generalized anxiety disorder) 11/03/2017  . Hypertension, essential, benign 11/03/2017  . Bilateral primary osteoarthritis of knee 09/11/2017   Past Medical History:  Diagnosis Date  . Allergy   . Anxiety   . Arthritis   . Depression   .  GERD (gastroesophageal reflux disease)   . HLD (hyperlipidemia)   . Hypertension   . Sleep apnea   . Sleep apnea with use of continuous positive airway pressure (CPAP)    uses CPAP nightly    Family History  Problem Relation Age of Onset  . Arthritis Mother   . Breast cancer Mother   . Diabetes Father   . Heart disease Father   . Colon cancer Neg Hx   . Esophageal cancer Neg Hx   . Inflammatory bowel disease Neg Hx   . Liver disease Neg Hx   . Pancreatic cancer Neg Hx   . Rectal cancer Neg Hx   . Stomach cancer Neg Hx     Past Surgical History:  Procedure Laterality Date  . BICEPS TENDON REPAIR Right 11/2015   upper  . broken finger Right    Bone fusion from the broken finger  . KNEE ARTHROSCOPY WITH MEDIAL MENISECTOMY Left 01/01/2019   Procedure: LEFT KNEE ARTHROSCOPY WITH PARTIAL MEDIAL AND LATERAL MENISCECTOMY;  Surgeon: Leandrew Koyanagi, MD;  Location: [place];  Service: Orthopedics;  Laterality: Left;  . ROTATOR CUFF REPAIR Right 2013, 2017   Social History   Occupational History  . Occupation: retired  Tobacco Use  . Smoking status: Never Smoker  . Smokeless tobacco: Never Used  Substance and Sexual Activity  . Alcohol use: Yes    Comment: 2-3 a day/ 2-3  times per [facility]  . Drug use: Never  . Sexual activity: Yes

## 2019-02-02 ENCOUNTER — Telehealth: Payer: Self-pay | Admitting: Orthopaedic Surgery

## 2019-02-02 NOTE — Telephone Encounter (Signed)
Was there a fax number?

## 2019-02-02 NOTE — Telephone Encounter (Signed)
Patient called.   His insurance needs his operative report sent over in order to file insurance   [employer]: [phone]  Ref #:  [id number]  Patient numbert:  [phone]

## 2019-02-20 ENCOUNTER — Other Ambulatory Visit: Payer: Self-pay | Admitting: Family Medicine

## 2019-02-20 DIAGNOSIS — L219 Seborrheic dermatitis, unspecified: Secondary | ICD-10-CM

## 2019-02-25 ENCOUNTER — Other Ambulatory Visit: Payer: Self-pay

## 2019-02-25 ENCOUNTER — Ambulatory Visit: Payer: BLUE CROSS/BLUE SHIELD | Admitting: Orthopaedic Surgery

## 2019-02-25 ENCOUNTER — Encounter: Payer: Self-pay | Admitting: Orthopaedic Surgery

## 2019-02-25 DIAGNOSIS — S83241A Other tear of medial meniscus, current injury, right knee, initial encounter: Secondary | ICD-10-CM

## 2019-02-25 NOTE — Progress Notes (Signed)
Office Visit Note   Patient: Harold Brown           Date of Birth: 11-28-1955           MRN: [id number] Visit Date: 02/25/2019              Requested by: Martinique, Betty G, MD [address] [place],  [place] [address] PCP: Martinique, Betty G, MD   Assessment & Plan: Visit Diagnoses:  1. Acute medial meniscus tear of right knee, initial encounter     Plan: At this point, [name] has failed a home exercise program and cortisone injection.  He continues to have severe pain in his right knee.  MRI of the right knee was reviewed with the patient.  Based on discussion on continued nonsurgical treatment vs surgical treatment, he has elected to proceed with arthroscopic partial medial menisectomy and chondroplasty.  Risks, benefits and alternatives reviewed.  Questions answered.  Follow-Up Instructions: Return for 1 week postop visit.   Orders:  No orders of the defined types were placed in this encounter.  No orders of the defined types were placed in this encounter.     Procedures: No procedures performed   Clinical Data: No additional findings.   Subjective: Chief Complaint  Patient presents with  . Left Knee - Pain, Follow-up    [name] is following up today for 7-week status post left knee partial medial and lateral meniscectomy and for his right knee.  His left knee is doing very well and reports no pain.  He feels that the surgery has helped significantly.  His right knee continues to bother him and he has significant popping.  He has pain on the medial side of his knee.  He has a small joint effusion.   Review of Systems   Objective: Vital Signs: There were no vitals taken for this visit.  Physical Exam  Ortho Exam Right knee exam shows a small joint effusion.  Medial joint line tenderness.  Palpable Baker's cyst that is tender to palpation.  Slight limitation range of motion with moderate pain. Specialty Comments:  No specialty comments  available.  Imaging: No results found.   PMFS History: Patient Active Problem List   Diagnosis Date Noted  . Gastroesophageal reflux disease with esophagitis without hemorrhage 12/04/2018  . Acute medial meniscus tear of right knee 11/27/2018  . Acute medial meniscus tear of left knee 11/27/2018  . Hyperlipidemia, mixed 03/06/2018  . Periumbilical pain 123456  . History of colonic polyps 01/27/2018  . Cystic acne vulgaris 11/03/2017  . Insomnia 11/03/2017  . GAD (generalized anxiety disorder) 11/03/2017  . Hypertension, essential, benign 11/03/2017  . Bilateral primary osteoarthritis of knee 09/11/2017   Past Medical History:  Diagnosis Date  . Allergy   . Anxiety   . Arthritis   . Depression   . GERD (gastroesophageal reflux disease)   . HLD (hyperlipidemia)   . Hypertension   . Sleep apnea   . Sleep apnea with use of continuous positive airway pressure (CPAP)    uses CPAP nightly    Family History  Problem Relation Age of Onset  . Arthritis Mother   . Breast cancer Mother   . Diabetes Father   . Heart disease Father   . Colon cancer Neg Hx   . Esophageal cancer Neg Hx   . Inflammatory bowel disease Neg Hx   . Liver disease Neg Hx   . Pancreatic cancer Neg Hx   . Rectal cancer Neg Hx   .  Stomach cancer Neg Hx     Past Surgical History:  Procedure Laterality Date  . BICEPS TENDON REPAIR Right 11/2015   upper  . broken finger Right    Bone fusion from the broken finger  . KNEE ARTHROSCOPY WITH MEDIAL MENISECTOMY Left 01/01/2019   Procedure: LEFT KNEE ARTHROSCOPY WITH PARTIAL MEDIAL AND LATERAL MENISCECTOMY;  Surgeon: Leandrew Koyanagi, MD;  Location: [facility];  Service: Orthopedics;  Laterality: Left;  . ROTATOR CUFF REPAIR Right 2013, 2017   Social History   Occupational History  . Occupation: retired  Tobacco Use  . Smoking status: Never Smoker  . Smokeless tobacco: Never Used  Substance and Sexual Activity  . Alcohol use: Yes     Comment: 2-3 a day/ 2-3 times per [facility]  . Drug use: Never  . Sexual activity: Yes

## 2019-03-01 ENCOUNTER — Encounter: Payer: Self-pay | Admitting: Family Medicine

## 2019-03-01 ENCOUNTER — Other Ambulatory Visit: Payer: Self-pay

## 2019-03-01 ENCOUNTER — Telehealth: Payer: Self-pay

## 2019-03-01 ENCOUNTER — Ambulatory Visit (INDEPENDENT_AMBULATORY_CARE_PROVIDER_SITE_OTHER): Payer: BLUE CROSS/BLUE SHIELD | Admitting: Family Medicine

## 2019-03-01 VITALS — BP 128/80 | HR 66 | Temp 96.0°F | Resp 12 | Ht 72.0 in | Wt 182.0 lb

## 2019-03-01 DIAGNOSIS — Z1159 Encounter for screening for other viral diseases: Secondary | ICD-10-CM

## 2019-03-01 DIAGNOSIS — E782 Mixed hyperlipidemia: Secondary | ICD-10-CM | POA: Diagnosis not present

## 2019-03-01 DIAGNOSIS — Z9189 Other specified personal risk factors, not elsewhere classified: Secondary | ICD-10-CM

## 2019-03-01 DIAGNOSIS — Z0000 Encounter for general adult medical examination without abnormal findings: Secondary | ICD-10-CM | POA: Diagnosis not present

## 2019-03-01 DIAGNOSIS — I10 Essential (primary) hypertension: Secondary | ICD-10-CM

## 2019-03-01 DIAGNOSIS — I8500 Esophageal varices without bleeding: Secondary | ICD-10-CM | POA: Insufficient documentation

## 2019-03-01 DIAGNOSIS — F411 Generalized anxiety disorder: Secondary | ICD-10-CM

## 2019-03-01 DIAGNOSIS — K2100 Gastro-esophageal reflux disease with esophagitis, without bleeding: Secondary | ICD-10-CM

## 2019-03-01 DIAGNOSIS — Z23 Encounter for immunization: Secondary | ICD-10-CM | POA: Diagnosis not present

## 2019-03-01 DIAGNOSIS — Z125 Encounter for screening for malignant neoplasm of prostate: Secondary | ICD-10-CM

## 2019-03-01 DIAGNOSIS — B351 Tinea unguium: Secondary | ICD-10-CM

## 2019-03-01 MED ORDER — EFINACONAZOLE 10 % EX SOLN: 2.0000 [drp] | Freq: Every day | CUTANEOUS | Status: DC

## 2019-03-01 MED ORDER — SERTRALINE HCL 50 MG PO TABS: 50.0000 mg | ORAL_TABLET | Freq: Every day | ORAL | 2 refills | Status: DC

## 2019-03-01 MED ORDER — EFINACONAZOLE 10 % EX SOLN: 2.0000 [drp] | Freq: Every day | CUTANEOUS | 1 refills | Status: DC

## 2019-03-01 NOTE — Telephone Encounter (Signed)
Pharmacy sent a fax - Efinaconazole 10% solution is over $700, asking for an alternative.

## 2019-03-01 NOTE — Assessment & Plan Note (Signed)
BP adequately controlled. No changes in current management. Continue low fat diet and monitoring BP regularly.

## 2019-03-01 NOTE — Telephone Encounter (Signed)
There is not a topical Rx option. The other options are oral medication for 12 weeks or he could try OTC topical products for onychomycosis + Vicks vapor rub and soaking feet in vinegar with water, one-to-one ratio.  Thanks, BJ

## 2019-03-01 NOTE — Assessment & Plan Note (Signed)
Problem is well controlled. Continue sertraline 50 mg daily and alprazolam 0.5 mg twice daily as needed.

## 2019-03-01 NOTE — Assessment & Plan Note (Signed)
This problem could be causing upper chest discomfort. For now continue omeprazole 40 mg daily. GERD precautions. If problem is persistent, will change to a different PPI.

## 2019-03-01 NOTE — Telephone Encounter (Signed)
I spoke with pt. He would like to try the oral medication for 12 weeks.

## 2019-03-01 NOTE — Progress Notes (Signed)
HPI:  Harold Brown is a 64 y.o.male here today for his routine physical examination and chronic disease management. Last visit on 09/04/2018. Since his last visit he has follow with orthopedist for knee OA, had left TKR and planning on right TNR on 03/10/19. Last CPE: > 1 year ago. He lives with his wife.  Regular exercise 3 or more times per week: He has not been consistent due to knee pain. Following a healthy diet: Yes, he is cooking at home, eating plenty of vegetables and fruit, and fish 3 times per week. Sleeps about 8 hours.  Chronic medical problems: Anxiety, hyperlipidemia, hypertension.  Immunization History  Administered Date(s) Administered  . Influenza,inj,Quad PF,6+ Mos 11/03/2017, 03/01/2019  . Tdap 12/05/2017  . Zoster Recombinat (Shingrix) 03/01/2019   -Hep C screening: Has not been done.  Last colon cancer screening: Colonoscopy on 02/10/18. Last prostate ca screening: Not sure. Nocturia x 1 occasionally. Negative for changes in urinary frequency or urine stream.  -Denies high alcohol intake, tobacco use, or Hx of illicit drug use. He drinks 6 beers per week.  -Concerns and/or follow up today:   HLD: He stopped medication a few months ago. He is trying to do better with a low-fat diet. Statin medication was causing GI symptoms.  Anxiety: Currently he is on sertraline 50 mg daily and alprazolam 0.5 mg twice daily as needed. Alprazolam also helps him with sleep. Some times he wakes up around 3 am and has difficulty going back to sleep.  Negative for depressed mood. He feels like anxiety has improved.  Hypertension: Currently he is on amlodipine-olmesartan 5-40 mg daily and Metoprolol succinate 100 mg daily. He checking BP regularly, 120s/70s. Negative for unusual headache, CP, orthopnea, PND, or edema.  Today he is complaining about 2 months of great toenails changes. No history of trauma. He has not tried OTC medications. Problem is  progressively getting worse.  Occasionally he has bilateral upper chest burning-like sensation with deep breathing. History of GERD, currently is on omeprazole 40 mg daily. + Heartburn, seldom. EGD on 07/02/2018:Esophagitis healed. Some white nummular lesions in esophageal mucosa but improved.   Review of Systems  Constitutional: Negative for activity change, appetite change, fatigue and fever.  HENT: Positive for postnasal drip. Negative for dental problem, nosebleeds, sore throat and trouble swallowing.   Eyes: Negative for redness and visual disturbance.  Respiratory: Negative for cough, shortness of breath and wheezing.   Cardiovascular: Negative for leg swelling.  Gastrointestinal: Negative for abdominal pain, blood in stool, nausea and vomiting.  Endocrine: Negative for cold intolerance, heat intolerance, polydipsia, polyphagia and polyuria.  Genitourinary: Negative for decreased urine volume, dysuria, genital sores, hematuria and testicular pain.  Musculoskeletal: Negative for arthralgias, back pain, joint swelling and myalgias.  Skin: Negative for color change and rash.  Allergic/Immunologic: Positive for environmental allergies.  Neurological: Negative for dizziness, syncope and weakness.  Hematological: Negative for adenopathy. Does not bruise/bleed easily.  Psychiatric/Behavioral: Negative for confusion and hallucinations.  All other systems reviewed and are negative.  Current Outpatient Medications on File Prior to Visit  Medication Sig Dispense Refill  . ALPRAZolam (XANAX) 0.5 MG tablet TAKE 1 TABLET BY MOUTH TWICE A DAY AS NEEDED FOR ANXIETY 50 tablet 3  . amLODipine-olmesartan (AZOR) 5-40 MG tablet TAKE 1 TABLET BY MOUTH EVERY DAY 90 tablet 1  . Ascorbic Acid (VITAMIN C ADULT GUMMIES PO) Take by mouth 2 (two) times daily.    . Betamethasone Valerate 0.12 % foam APPLY  1 APPLICATION TOPICALLY 2 (TWO) TIMES DAILY AS NEEDED. 100 g 1  . diclofenac sodium (VOLTAREN) 1 % GEL  Apply 2 g topically 4 (four) times daily. 350 g 6  . ketoconazole (NIZORAL) 2 % shampoo APPLY 1 APPLICATION TOPICALLY 2 (TWO) TIMES A WEEK. 120 mL 4  . metoprolol succinate (TOPROL-XL) 100 MG 24 hr tablet TAKE 1 TABLET BY MOUTH EVERY DAY WITH OR IMMEDIATELY FOLLOWING A MEAL 90 tablet 2  . metroNIDAZOLE (METROGEL) 0.75 % gel     . Multiple Vitamins-Minerals (HM MULTIVITAMIN ADULT GUMMY PO) Take by mouth 2 (two) times daily.    Marland Kitchen omeprazole (PRILOSEC) 40 MG capsule TAKE 1 CAPSULE BY MOUTH EVERY DAY 90 capsule 1   No current facility-administered medications on file prior to visit.    Past Medical History:  Diagnosis Date  . Allergy   . Anxiety   . Arthritis   . Depression   . GERD (gastroesophageal reflux disease)   . HLD (hyperlipidemia)   . Hypertension   . Sleep apnea   . Sleep apnea with use of continuous positive airway pressure (CPAP)    uses CPAP nightly    Past Surgical History:  Procedure Laterality Date  . BICEPS TENDON REPAIR Right 11/2015   upper  . broken finger Right    Bone fusion from the broken finger  . KNEE ARTHROSCOPY WITH MEDIAL MENISECTOMY Left 01/01/2019   Procedure: LEFT KNEE ARTHROSCOPY WITH PARTIAL MEDIAL AND LATERAL MENISCECTOMY;  Surgeon: Leandrew Koyanagi, MD;  Location: [facility];  Service: Orthopedics;  Laterality: Left;  . ROTATOR CUFF REPAIR Right 2013, 2017    Allergies  Allergen Reactions  . Coconut Oil Other (See Comments)    Itching and scabs in head after washing hair with it  . Erythromycin Rash    Family History  Problem Relation Age of Onset  . Arthritis Mother   . Breast cancer Mother   . Diabetes Father   . Heart disease Father   . Colon cancer Neg Hx   . Esophageal cancer Neg Hx   . Inflammatory bowel disease Neg Hx   . Liver disease Neg Hx   . Pancreatic cancer Neg Hx   . Rectal cancer Neg Hx   . Stomach cancer Neg Hx     Social History   Socioeconomic History  . Marital status: Married    Spouse name:  Earley Abide  . Number of children: 0  . Years of education: Not on file  . Highest education level: Not on file  Occupational History  . Occupation: retired  Tobacco Use  . Smoking status: Never Smoker  . Smokeless tobacco: Never Used  Substance and Sexual Activity  . Alcohol use: Yes    Comment: 2-3 a day/ 2-3 times per [facility]  . Drug use: Never  . Sexual activity: Yes  Other Topics Concern  . Not on file  Social History Narrative  . Not on file   Social Determinants of Health   Financial Resource Strain:   . Difficulty of Paying Living Expenses: Not on file  Food Insecurity:   . Worried About Charity fundraiser in the Last Year: Not on file  . Ran Out of Food in the Last Year: Not on file  Transportation Needs:   . Lack of Transportation (Medical): Not on file  . Lack of Transportation (Non-Medical): Not on file  Physical Activity:   . Days of Exercise per Week: Not on file  . Minutes  of Exercise per Session: Not on file  Stress:   . Feeling of Stress : Not on file  Social Connections:   . Frequency of Communication with Friends and Family: Not on file  . Frequency of Social Gatherings with Friends and Family: Not on file  . Attends Religious Services: Not on file  . Active Member of Clubs or Organizations: Not on file  . Attends Archivist Meetings: Not on file  . Marital Status: Not on file     Vitals:   03/01/19 1127  BP: 128/80  Pulse: 66  Resp: 12  Temp: (!) 96 F (35.6 C)  SpO2: 99%   Body mass index is 24.68 kg/m.   Wt Readings from Last 3 Encounters:  03/01/19 182 lb (82.6 kg)  01/01/19 180 lb 12.4 oz (82 kg)  12/03/18 182 lb (82.6 kg)      Physical Exam  Nursing note and vitals reviewed. Constitutional: He is oriented to person, place, and time. He appears well-developed. No distress.  HENT:  Head: Atraumatic.  Right Ear: Tympanic membrane, external ear and ear canal normal.  Left Ear: Tympanic membrane, external ear and ear  canal normal.  Mouth/Throat: Oropharynx is clear and moist and mucous membranes are normal.  Eyes: Pupils are equal, round, and reactive to light. Conjunctivae and EOM are normal.  Neck: No tracheal deviation present. No thyromegaly present.  Cardiovascular: Normal rate and regular rhythm.  No murmur heard. Pulses:      Dorsalis pedis pulses are 2+ on the right side and 2+ on the left side.  Respiratory: Effort normal and breath sounds normal. No respiratory distress.  GI: Soft. He exhibits no mass. There is no hepatomegaly. There is no abdominal tenderness.  Genitourinary:    Genitourinary Comments: No concerns.   Musculoskeletal:        General: No tenderness or edema.     Cervical back: Normal range of motion.     Comments: No major deformities appreciated and no signs of synovitis.  Lymphadenopathy:    He has no cervical adenopathy.       Right: No supraclavicular adenopathy present.       Left: No supraclavicular adenopathy present.  Neurological: He is alert and oriented to person, place, and time. He has normal strength. No cranial nerve deficit or sensory deficit. Coordination and gait normal.  Reflex Scores:      Bicep reflexes are 2+ on the right side and 2+ on the left side.      Patellar reflexes are 2+ on the right side and 2+ on the left side. Skin: Skin is warm. No erythema.  Psychiatric: He has a normal mood and affect. Cognition and memory are normal.  Well-groomed, good eye contact.    ASSESSMENT AND PLAN:  Mr. Traxton was seen today for follow-up and annual exam.  Diagnoses and all orders for this visit:  Orders Placed This Encounter  Procedures  . Varicella-zoster vaccine IM  . Flu Vaccine QUAD 36+ mos IM  . Lipid panel  . Comprehensive metabolic panel  . PSA(Must document that pt has been informed of limitations of PSA testing.)  . Hepatitis C antibody    Routine general medical examination at a health care facility We discussed the importance of  regular physical activity and healthy diet for prevention of chronic illness and/or complications. Preventive guidelines reviewed. Vaccination updated, Shingrix given today.  Next CPE in a year.  Onychomycosis of great toe Educated about diagnosis. He agrees with  trying topical treatment. Soaking feet in vinegar water, one-to-one ratio, may also help.  -     Efinaconazole 10 % SOLN; Apply 2 drops topically daily.  Prostate cancer screening -     PSA(Must document that pt has been informed of limitations of PSA testing.); Future  Encounter for HCV screening test for high risk patient -     Hepatitis C antibody; Future  Need for influenza vaccination -     Flu Vaccine QUAD 36+ mos IM  Need for shingles vaccine -     Varicella-zoster vaccine IM  Hypertension, essential, benign BP adequately controlled. No changes in current management. Continue low fat diet and monitoring BP regularly.  Hyperlipidemia, mixed Continue non pharmacologic treatment for now. Further recommendations according to FLP results.  GAD (generalized anxiety disorder) Problem is well controlled. Continue sertraline 50 mg daily and alprazolam 0.5 mg twice daily as needed.  Gastroesophageal reflux disease with esophagitis without hemorrhage This problem could be causing upper chest discomfort. For now continue omeprazole 40 mg daily. GERD precautions. If problem is persistent, will change to a different PPI.  Idiopathic esophageal varices without bleeding (HCC) Last EGD , 06/2018, negative varices were not seen. Follow with GI prn.   Today we do not have lab service, he will be back in 8 weeks for labs and second dose of Shingrix.  Return in 6 months (on 08/29/2019) for HTN,anxiety. Labs in 8 weeks.   Brei Pociask G. Martinique, MD  [facility]. [place] office.

## 2019-03-01 NOTE — Patient Instructions (Signed)
  Routine general medical examination at a health care facility  Hypertension, essential, benign - Plan: Comprehensive metabolic panel  Hyperlipidemia, mixed - Plan: Lipid panel, Comprehensive metabolic panel  Onychomycosis of great toe  Prostate cancer screening - Plan: PSA(Must document that pt has been informed of limitations of PSA testing.)  Encounter for HCV screening test for high risk patient - Plan: Hepatitis C antibody   At least 150 minutes of moderate exercise per week, daily brisk walking for 15-30 min is a good exercise option. Healthy diet low in saturated (animal) fats and sweets and consisting of fresh fruits and vegetables, lean meats such as fish and white chicken and whole grains.  - Vaccines:  Tdap vaccine every 10 years.  Shingles vaccine recommended at age 63, could be given after 63 years of age but not sure about insurance coverage.  Pneumonia vaccines:  Prevnar 33 at 61 and Pneumovax at 34.   -Screening recommendations for low/normal risk males:  Screening for diabetes at age 31 and every 3 years. Earlier screening if cardiovascular risk factors.   Lipid screening N/A  Colon cancer screening at age 72 and until age 51.  Prostate cancer screening: some controversy, starts usually at 13: Rectal exam and PSA.  Aortic Abdominal Aneurism once between 33 and 67 years old if ever smoker.  Also recommended:  1. Dental visit- Brush and floss your teeth twice daily; visit your dentist twice a year. 2. Eye doctor- Get an eye exam at least every 2 years. 3. Helmet use- Always wear a helmet when riding a bicycle, motorcycle, rollerblading or skateboarding. 4. Safe sex- If you may be exposed to sexually transmitted infections, use a condom. 5. Seat belts- Seat belts can save your live; always wear one. 6. Smoke/Carbon Monoxide detectors- These detectors need to be installed on the appropriate level of your home. Replace batteries at least once a year. 7. Skin  cancer- When out in the sun please cover up and use sunscreen 15 SPF or higher. 8. Violence- If anyone is threatening or hurting you, please tell your healthcare provider.  9. Drink alcohol in moderation- Limit alcohol intake to one drink or less per day. Never drink and drive.

## 2019-03-01 NOTE — Assessment & Plan Note (Signed)
Last EGD , 06/2018, negative varices were not seen. Follow with GI prn.

## 2019-03-01 NOTE — Assessment & Plan Note (Signed)
Continue non pharmacologic treatment for now. Further recommendations according to FLP results.

## 2019-03-02 ENCOUNTER — Other Ambulatory Visit: Payer: Self-pay | Admitting: Family Medicine

## 2019-03-02 MED ORDER — TERBINAFINE HCL 250 MG PO TABS: 250.0000 mg | ORAL_TABLET | Freq: Every day | ORAL | 0 refills | Status: DC

## 2019-03-02 NOTE — Telephone Encounter (Signed)
Prescription for Lamisil 250 mg to take once daily for 12 weeks sent to his pharmacy. Thanks, BJ

## 2019-03-03 ENCOUNTER — Encounter (HOSPITAL_BASED_OUTPATIENT_CLINIC_OR_DEPARTMENT_OTHER): Payer: Self-pay | Admitting: Orthopaedic Surgery

## 2019-03-03 ENCOUNTER — Other Ambulatory Visit: Payer: Self-pay

## 2019-03-06 ENCOUNTER — Other Ambulatory Visit (HOSPITAL_COMMUNITY): Payer: BLUE CROSS/BLUE SHIELD

## 2019-03-08 ENCOUNTER — Other Ambulatory Visit (HOSPITAL_COMMUNITY)
Admission: RE | Admit: 2019-03-08 | Discharge: 2019-03-08 | Disposition: A | Discharge status: Home / Self Care | Payer: BLUE CROSS/BLUE SHIELD | Source: Ambulatory Visit | Attending: Orthopaedic Surgery | Admitting: Orthopaedic Surgery

## 2019-03-08 DIAGNOSIS — Z20822 Contact with and (suspected) exposure to covid-19: Secondary | ICD-10-CM | POA: Insufficient documentation

## 2019-03-08 DIAGNOSIS — Z01812 Encounter for preprocedural laboratory examination: Secondary | ICD-10-CM | POA: Insufficient documentation

## 2019-03-08 LAB — SARS CORONAVIRUS 2 (TAT 6-24 HRS): SARS Coronavirus 2: NEGATIVE

## 2019-03-08 NOTE — Progress Notes (Signed)

## 2019-03-10 ENCOUNTER — Other Ambulatory Visit: Payer: Self-pay

## 2019-03-10 ENCOUNTER — Ambulatory Visit (HOSPITAL_BASED_OUTPATIENT_CLINIC_OR_DEPARTMENT_OTHER)
Admission: RE | Admit: 2019-03-10 | Discharge: 2019-03-10 | Disposition: A | Discharge status: Home / Self Care | Payer: BLUE CROSS/BLUE SHIELD | Attending: Orthopaedic Surgery | Admitting: Orthopaedic Surgery

## 2019-03-10 ENCOUNTER — Ambulatory Visit (HOSPITAL_BASED_OUTPATIENT_CLINIC_OR_DEPARTMENT_OTHER): Payer: BLUE CROSS/BLUE SHIELD | Admitting: Anesthesiology

## 2019-03-10 ENCOUNTER — Encounter (HOSPITAL_BASED_OUTPATIENT_CLINIC_OR_DEPARTMENT_OTHER): Payer: Self-pay | Admitting: Orthopaedic Surgery

## 2019-03-10 ENCOUNTER — Encounter (HOSPITAL_BASED_OUTPATIENT_CLINIC_OR_DEPARTMENT_OTHER)
Admission: RE | Disposition: A | Discharge status: Home / Self Care | Payer: Self-pay | Source: Home / Self Care | Attending: Orthopaedic Surgery

## 2019-03-10 ENCOUNTER — Encounter: Payer: Self-pay | Admitting: Orthopaedic Surgery

## 2019-03-10 DIAGNOSIS — Z79899 Other long term (current) drug therapy: Secondary | ICD-10-CM | POA: Insufficient documentation

## 2019-03-10 DIAGNOSIS — M170 Bilateral primary osteoarthritis of knee: Secondary | ICD-10-CM | POA: Diagnosis not present

## 2019-03-10 DIAGNOSIS — I10 Essential (primary) hypertension: Secondary | ICD-10-CM | POA: Diagnosis not present

## 2019-03-10 DIAGNOSIS — S83231A Complex tear of medial meniscus, current injury, right knee, initial encounter: Secondary | ICD-10-CM | POA: Insufficient documentation

## 2019-03-10 DIAGNOSIS — K219 Gastro-esophageal reflux disease without esophagitis: Secondary | ICD-10-CM | POA: Insufficient documentation

## 2019-03-10 DIAGNOSIS — M94261 Chondromalacia, right knee: Secondary | ICD-10-CM | POA: Diagnosis not present

## 2019-03-10 DIAGNOSIS — F419 Anxiety disorder, unspecified: Secondary | ICD-10-CM | POA: Insufficient documentation

## 2019-03-10 DIAGNOSIS — M65969 Unspecified synovitis and tenosynovitis, unspecified lower leg: Secondary | ICD-10-CM

## 2019-03-10 DIAGNOSIS — M659 Synovitis and tenosynovitis, unspecified: Secondary | ICD-10-CM | POA: Diagnosis not present

## 2019-03-10 DIAGNOSIS — E785 Hyperlipidemia, unspecified: Secondary | ICD-10-CM | POA: Insufficient documentation

## 2019-03-10 DIAGNOSIS — X58XXXA Exposure to other specified factors, initial encounter: Secondary | ICD-10-CM | POA: Diagnosis not present

## 2019-03-10 DIAGNOSIS — G4730 Sleep apnea, unspecified: Secondary | ICD-10-CM | POA: Insufficient documentation

## 2019-03-10 DIAGNOSIS — F329 Major depressive disorder, single episode, unspecified: Secondary | ICD-10-CM | POA: Insufficient documentation

## 2019-03-10 DIAGNOSIS — S83241A Other tear of medial meniscus, current injury, right knee, initial encounter: Secondary | ICD-10-CM | POA: Diagnosis not present

## 2019-03-10 HISTORY — PX: KNEE ARTHROSCOPY WITH MEDIAL MENISECTOMY: SHX5651

## 2019-03-10 HISTORY — PX: CHONDROPLASTY: SHX5177

## 2019-03-10 SURGERY — ARTHROSCOPY, KNEE, WITH MEDIAL MENISCECTOMY
Anesthesia: General | Site: Knee | Laterality: Right

## 2019-03-10 MED ORDER — KETOROLAC TROMETHAMINE 30 MG/ML IJ SOLN
INTRAMUSCULAR | Status: DC | PRN
  Administered 2019-03-10: 30 mg via INTRAVENOUS

## 2019-03-10 MED ORDER — LIDOCAINE 2% (20 MG/ML) 5 ML SYRINGE
INTRAMUSCULAR | Status: DC | PRN
  Administered 2019-03-10: 80 mg via INTRAVENOUS

## 2019-03-10 MED ORDER — FENTANYL CITRATE (PF) 100 MCG/2ML IJ SOLN
INTRAMUSCULAR | Status: AC
  Filled 2019-03-10: qty 2

## 2019-03-10 MED ORDER — BUPIVACAINE HCL (PF) 0.25 % IJ SOLN
INTRAMUSCULAR | Status: DC | PRN
  Administered 2019-03-10: 20 mL via INTRA_ARTICULAR

## 2019-03-10 MED ORDER — BUPIVACAINE HCL (PF) 0.5 % IJ SOLN
INTRAMUSCULAR | Status: AC
  Filled 2019-03-10: qty 30

## 2019-03-10 MED ORDER — FENTANYL CITRATE (PF) 100 MCG/2ML IJ SOLN
INTRAMUSCULAR | Status: DC | PRN
  Administered 2019-03-10 (×2): 50 ug via INTRAVENOUS

## 2019-03-10 MED ORDER — CEFAZOLIN SODIUM-DEXTROSE 2-4 GM/100ML-% IV SOLN
INTRAVENOUS | Status: AC
  Filled 2019-03-10: qty 100

## 2019-03-10 MED ORDER — FENTANYL CITRATE (PF) 100 MCG/2ML IJ SOLN
50.0000 ug | Freq: Once | INTRAMUSCULAR | Status: AC
  Administered 2019-03-10: 50 ug via INTRAVENOUS

## 2019-03-10 MED ORDER — DEXAMETHASONE SODIUM PHOSPHATE 10 MG/ML IJ SOLN
INTRAMUSCULAR | Status: AC
  Filled 2019-03-10: qty 1

## 2019-03-10 MED ORDER — LACTATED RINGERS IV SOLN: INTRAVENOUS | Status: DC

## 2019-03-10 MED ORDER — LACTATED RINGERS IV SOLN: INTRAVENOUS | Status: DC | PRN

## 2019-03-10 MED ORDER — OXYCODONE HCL 5 MG PO TABS
5.0000 mg | ORAL_TABLET | Freq: Once | ORAL | Status: AC | PRN
  Administered 2019-03-10: 09:00:00 5 mg via ORAL

## 2019-03-10 MED ORDER — CHLORHEXIDINE GLUCONATE 4 % EX LIQD: 60.0000 mL | Freq: Once | CUTANEOUS | Status: DC

## 2019-03-10 MED ORDER — HYDROMORPHONE HCL 1 MG/ML IJ SOLN
INTRAMUSCULAR | Status: AC
  Filled 2019-03-10: qty 0.5

## 2019-03-10 MED ORDER — HYDROCODONE-ACETAMINOPHEN 5-325 MG PO TABS: 1.0000 | ORAL_TABLET | Freq: Three times a day (TID) | ORAL | 0 refills | Status: DC | PRN

## 2019-03-10 MED ORDER — SODIUM CHLORIDE 0.9 % IR SOLN
Status: DC | PRN
  Administered 2019-03-10: 4000 mL

## 2019-03-10 MED ORDER — OXYCODONE HCL 5 MG PO TABS
ORAL_TABLET | ORAL | Status: AC
  Filled 2019-03-10: qty 1

## 2019-03-10 MED ORDER — OXYCODONE HCL 5 MG/5ML PO SOLN: 5.0000 mg | Freq: Once | ORAL | Status: AC | PRN

## 2019-03-10 MED ORDER — PROPOFOL 500 MG/50ML IV EMUL
INTRAVENOUS | Status: AC
  Filled 2019-03-10: qty 50

## 2019-03-10 MED ORDER — HYDROMORPHONE HCL 1 MG/ML IJ SOLN
0.2500 mg | INTRAMUSCULAR | Status: DC | PRN
  Administered 2019-03-10: 09:00:00 0.5 mg via INTRAVENOUS

## 2019-03-10 MED ORDER — DEXAMETHASONE SODIUM PHOSPHATE 10 MG/ML IJ SOLN
INTRAMUSCULAR | Status: DC | PRN
  Administered 2019-03-10: 10 mg via INTRAVENOUS

## 2019-03-10 MED ORDER — PROPOFOL 10 MG/ML IV BOLUS
INTRAVENOUS | Status: DC | PRN
  Administered 2019-03-10: 200 mg via INTRAVENOUS

## 2019-03-10 MED ORDER — MIDAZOLAM HCL 2 MG/2ML IJ SOLN
INTRAMUSCULAR | Status: AC
  Filled 2019-03-10: qty 2

## 2019-03-10 MED ORDER — PROMETHAZINE HCL 25 MG/ML IJ SOLN: 6.2500 mg | INTRAMUSCULAR | Status: DC | PRN

## 2019-03-10 MED ORDER — CEFAZOLIN SODIUM-DEXTROSE 2-4 GM/100ML-% IV SOLN
2.0000 g | INTRAVENOUS | Status: AC
  Administered 2019-03-10: 07:00:00 2 g via INTRAVENOUS

## 2019-03-10 MED ORDER — MIDAZOLAM HCL 5 MG/5ML IJ SOLN
INTRAMUSCULAR | Status: DC | PRN
  Administered 2019-03-10: 2 mg via INTRAVENOUS

## 2019-03-10 MED ORDER — ONDANSETRON HCL 4 MG/2ML IJ SOLN
INTRAMUSCULAR | Status: AC
  Filled 2019-03-10: qty 2

## 2019-03-10 MED ORDER — ONDANSETRON HCL 4 MG/2ML IJ SOLN
INTRAMUSCULAR | Status: DC | PRN
  Administered 2019-03-10: 4 mg via INTRAVENOUS

## 2019-03-10 MED ORDER — BUPIVACAINE HCL (PF) 0.25 % IJ SOLN
INTRAMUSCULAR | Status: AC
  Filled 2019-03-10: qty 60

## 2019-03-10 MED ORDER — MEPERIDINE HCL 25 MG/ML IJ SOLN: 6.2500 mg | INTRAMUSCULAR | Status: DC | PRN

## 2019-03-10 MED ORDER — LIDOCAINE HCL (PF) 1 % IJ SOLN
INTRAMUSCULAR | Status: AC
  Filled 2019-03-10: qty 30

## 2019-03-10 MED ORDER — HYDRALAZINE HCL 20 MG/ML IJ SOLN
INTRAMUSCULAR | Status: DC | PRN
  Administered 2019-03-10: 5 mg via INTRAVENOUS

## 2019-03-10 SURGICAL SUPPLY — 36 items
BANDAGE ESMARK 6X9 LF (GAUZE/BANDAGES/DRESSINGS) IMPLANT
BLADE SHAVER TORPEDO 4X13 (MISCELLANEOUS) ×2 IMPLANT
BNDG ELASTIC 6X5.8 VLCR STR LF (GAUZE/BANDAGES/DRESSINGS) ×6 IMPLANT
BNDG ESMARK 6X9 LF (GAUZE/BANDAGES/DRESSINGS)
COVER WAND RF STERILE (DRAPES) IMPLANT
CUFF TOURN SGL QUICK 34 (TOURNIQUET CUFF) ×2
CUFF TRNQT CYL 34X4.125X (TOURNIQUET CUFF) ×1 IMPLANT
DECANTER SPIKE VIAL GLASS SM (MISCELLANEOUS) ×2 IMPLANT
DRAPE ARTHROSCOPY W/POUCH 90 (DRAPES) ×3 IMPLANT
DRAPE IMP U-DRAPE 54X76 (DRAPES) ×3 IMPLANT
DRAPE U-SHAPE 47X51 STRL (DRAPES) ×3 IMPLANT
DURAPREP 26ML APPLICATOR (WOUND CARE) ×3 IMPLANT
GAUZE SPONGE 4X4 12PLY STRL (GAUZE/BANDAGES/DRESSINGS) ×3 IMPLANT
GAUZE XEROFORM 1X8 LF (GAUZE/BANDAGES/DRESSINGS) ×3 IMPLANT
GLOVE BIOGEL PI IND STRL 7.0 (GLOVE) ×1 IMPLANT
GLOVE BIOGEL PI INDICATOR 7.0 (GLOVE) ×6
GLOVE ECLIPSE 6.5 STRL STRAW (GLOVE) ×2 IMPLANT
GLOVE ECLIPSE 7.0 STRL STRAW (GLOVE) ×3 IMPLANT
GLOVE SKINSENSE NS SZ7.5 (GLOVE) ×2
GLOVE SKINSENSE STRL SZ7.5 (GLOVE) ×1 IMPLANT
GLOVE SURG SYN 7.5  E (GLOVE) ×2
GLOVE SURG SYN 7.5 E (GLOVE) ×1 IMPLANT
GLOVE SURG SYN 7.5 PF PI (GLOVE) ×1 IMPLANT
GOWN STRL REIN XL XLG (GOWN DISPOSABLE) ×3 IMPLANT
GOWN STRL REUS W/ TWL LRG LVL3 (GOWN DISPOSABLE) ×1 IMPLANT
GOWN STRL REUS W/ TWL XL LVL3 (GOWN DISPOSABLE) ×1 IMPLANT
GOWN STRL REUS W/TWL LRG LVL3 (GOWN DISPOSABLE) ×2
GOWN STRL REUS W/TWL XL LVL3 (GOWN DISPOSABLE) ×2
IV NS IRRIG 3000ML ARTHROMATIC (IV SOLUTION) ×4 IMPLANT
KNEE WRAP E Z 3 GEL PACK (MISCELLANEOUS) ×3 IMPLANT
MANIFOLD NEPTUNE II (INSTRUMENTS) ×3 IMPLANT
PACK ARTHROSCOPY DSU (CUSTOM PROCEDURE TRAY) ×3 IMPLANT
PACK BASIN DAY SURGERY FS (CUSTOM PROCEDURE TRAY) ×3 IMPLANT
SUT ETHILON 3 0 PS 1 (SUTURE) ×3 IMPLANT
TOWEL GREEN STERILE FF (TOWEL DISPOSABLE) ×5 IMPLANT
TUBING ARTHROSCOPY IRRIG 16FT (MISCELLANEOUS) ×3 IMPLANT

## 2019-03-10 NOTE — Discharge Instructions (Signed)
  Post-operative patient instructions  Knee Arthroscopy   . Ice:  Place intermittent ice or cooler pack over your knee, 30 minutes on and 30 minutes off.  Continue this for the first 72 hours after surgery, then save ice for use after therapy sessions or on more active days.   . Weight:  You may bear weight on your leg as your symptoms allow. . Crutches:  Use crutches (or walker) to assist in walking until told to discontinue by your physical therapist or physician. This will help to reduce pain. . Strengthening:  Perform simple thigh squeezes (isometric quad contractions) and straight leg lifts as you are able (3 sets of 5 to 10 repetitions, 3 times a day).  For the leg lifts, have someone support under your ankle in the beginning until you have increased strength enough to do this on your own.  To help get started on thigh squeezes, place a pillow under your knee and push down on the pillow with back of knee (sometimes easier to do than with your leg fully straight). . Motion:  Perform gentle knee motion as tolerated - this is gentle bending and straightening of the knee. Seated heel slides: you can start by sitting in a chair, remove your brace, and gently slide your heel back on the floor - allowing your knee to bend. Have someone help you straighten your knee (or use your other leg/foot hooked under your ankle.  . Dressing:  Perform 1st dressing change at 2 days postoperative. A moderate amount of blood tinged drainage is to be expected.  So if you bleed through the dressing on the first or second day or if you have fevers, it is fine to change the dressing/check the wounds early and redress wound. Elevate your leg.  If it bleeds through again, or if the incisions are leaking frank blood, please call the office. May change dressing every 1-2 days thereafter to help watch wounds. Can purchase Tegaderm (or 3M Nexcare) water resistant dressings at local pharmacy / [employer]. . Shower:  Light shower is  ok after 2 days.  Please take shower, NO bath. Recover with gauze and ace wrap to help keep wounds protected.   . Pain medication:  A narcotic pain medication has been prescribed.  Take as directed.  Typically you need narcotic pain medication more regularly during the first 3 to 5 days after surgery.  Decrease your use of the medication as the pain improves.  Narcotics can sometimes cause constipation, even after a few doses.  If you have problems with constipation, you can take an over the counter stool softener or light laxative.  If you have persistent problems, please notify your physician's office. . Physical therapy: Additional activity guidelines to be provided by your physician or physical therapist at follow-up visits.  . Driving: Do not recommend driving x 2 weeks post surgical, especially if surgery performed on right side. Should not drive while taking narcotic pain medications. It typically takes at least 2 weeks to restore sufficient neuromuscular function for normal reaction times for driving safety.  . Call [phone] for questions or problems. Evenings you will be forwarded to the hospital operator.  Ask for the orthopaedic physician on call. Please call if you experience:    o Redness, foul smelling, or persistent drainage from the surgical site  o worsening knee pain and swelling not responsive to medication  o any calf pain and or swelling of the lower leg  o temperatures greater than   101.5 F o other questions or concerns   Thank you for allowing [place] to be a part of your care.  OXYCODONE 5mg  GIVEN AT 0900   Post Anesthesia Home Care Instructions  Activity: Get plenty of rest for the remainder of the day. A responsible individual must stay with you for 24 hours following the procedure.  For the next 24 hours, DO NOT: -Drive a car -Paediatric nurse -Drink alcoholic beverages -Take any medication unless instructed by your physician -Make any legal decisions or sign  important papers.  Meals: Start with liquid foods such as gelatin or soup. Progress to regular foods as tolerated. Avoid greasy, spicy, heavy foods. If nausea and/or vomiting occur, drink only clear liquids until the nausea and/or vomiting subsides. Call your physician if vomiting continues.  Special Instructions/Symptoms: Your throat may feel dry or sore from the anesthesia or the breathing tube placed in your throat during surgery. If this causes discomfort, gargle with warm salt water. The discomfort should disappear within 24 hours.  If you had a scopolamine patch placed behind your ear for the management of post- operative nausea and/or vomiting:  1. The medication in the patch is effective for 72 hours, after which it should be removed.  Wrap patch in a tissue and discard in the trash. Wash hands thoroughly with soap and water. 2. You may remove the patch earlier than 72 hours if you experience unpleasant side effects which may include dry mouth, dizziness or visual disturbances. 3. Avoid touching the patch. Wash your hands with soap and water after contact with the patch.     Post Anesthesia Home Care Instructions  Activity: Get plenty of rest for the remainder of the day. A responsible individual must stay with you for 24 hours following the procedure.  For the next 24 hours, DO NOT: -Drive a car -Paediatric nurse -Drink alcoholic beverages -Take any medication unless instructed by your physician -Make any legal decisions or sign important papers.  Meals: Start with liquid foods such as gelatin or soup. Progress to regular foods as tolerated. Avoid greasy, spicy, heavy foods. If nausea and/or vomiting occur, drink only clear liquids until the nausea and/or vomiting subsides. Call your physician if vomiting continues.  Special Instructions/Symptoms: Your throat may feel dry or sore from the anesthesia or the breathing tube placed in your throat during surgery. If this causes  discomfort, gargle with warm salt water. The discomfort should disappear within 24 hours.  If you had a scopolamine patch placed behind your ear for the management of post- operative nausea and/or vomiting:  1. The medication in the patch is effective for 72 hours, after which it should be removed.  Wrap patch in a tissue and discard in the trash. Wash hands thoroughly with soap and water. 2. You may remove the patch earlier than 72 hours if you experience unpleasant side effects which may include dry mouth, dizziness or visual disturbances. 3. Avoid touching the patch. Wash your hands with soap and water after contact with the patch.     Safe Surgery and Sleep Apnea Sleep apnea is a condition in which breathing pauses or becomes shallow during sleep. Most people with the condition are not aware that they have it. It is important for your health care providers to know whether or not you have sleep apnea, especially if you are having surgery. Sleep apnea can increase your risk of complications during and after surgery. What is sleep apnea screening? Sleep apnea screening is a test  to determine if you are at risk for sleep apnea. Before you have surgery, get screened for sleep apnea and talk with your surgeon and primary health care provider about your results. Screening usually involves answering a list of questions about your sleep quality. Ask your health care provider if you can be screened, or take a screening test yourself. You can find these tests online at the American Sleep Apnea Association website. Some questions you may be asked include:  Do you snore?  Is your sleep restless?  Do you have daytime sleepiness?  Has a partner or spouse told you that you stop breathing during sleep?  Have you had trouble concentrating or memory loss? Answer these questions honestly. If a screening test is positive, this means you are at risk for the condition. Further testing may be needed to confirm a  diagnosis of sleep apnea. Why does sleep apnea increase the risk for complications? Untreated sleep apnea increases the risk for certain complications during and after surgery. This is because when you have sleep apnea, your airways are more sensitive to medicines used during surgery. The airways can collapse and block the flow of air.  Having untreated sleep apnea can increase your risk for:  A longer stay in the recovery room or hospital.  Breathing difficulties such as low oxygen levels after surgery.  Increased pain after surgery.  Irregular heart rhythms.  Stroke.  Heart attack. You and your health care provider can take steps to help prevent these and other complications. What should I do if I have sleep apnea?  Before surgery  Tell your health care provider and anesthesia specialist that you have sleep apnea. Discuss your individual risks based on your screening results, the type of surgery you will be having, and other medical conditions that you have.  If you have a sleep apnea device (positive airway pressure device), wear it as prescribed. If you have not been wearing your device, talk with your health care provider about why you have not been wearing it. There are ways to improve your use of the device, such as: ? Adjusting the mask. ? Adding humidified air. ? Getting treatment for nasal congestion.  Do not use any products that contain nicotine or tobacco, such as cigarettes and e-cigarettes. If you need help quitting, ask your health care provider. On the day of surgery  If instructed by your health care provider, bring your sleep apnea device with you.  Wear your sleep apnea device when you are sleeping during your hospital stay, or as told by your health care provider.  Ask your health care provider what special considerations will be taken during and after your surgery. After surgery  You may need to be given extra oxygen and wear a continuous oxygen monitor  (pulse oximetry).  For your safety, you may need to stay in the recovery room or hospital for longer than is normal.  Follow instructions from your health care provider about wearing your sleep device: ? Anytime you are sleeping, including during daytime naps. ? While taking prescription pain medicines, sleeping medicines, or medicines that make you drowsy.  If your health care provider approves, raise the head of your bed or lie on your side. Do not lie flat on your back.  Follow instructions from your health care provider about medicines: ? Avoid using sleep medicines unless they are prescribed by a health care provider who is aware of the results of your sleep apnea screening. ? Avoid using sleep medicines  while taking opioid pain medicine. ? Limit your use of opioid pain medicines as much as possible. Ask your health care provider what is a safe amount to use. ? Ask about using pain medicines that do not affect your breathing, such as NSAIDs or acetaminophen. Where to find more information For more information about sleep apnea screening and healthy sleep, visit these websites:  Centers for Disease Control and Prevention: [employer]pl  American Sleep Apnea Association: www.sleepapnea.org Contact a health care provider if:  You have sleep apnea or think you may be at risk for sleep apnea, and you are scheduled for surgery. Get help right away if:  You have trouble breathing.  You are very drowsy and cannot stay awake.  You are told that you have pauses in your breathing during sleep after surgery.  You have chest pain.  You have a fast heartbeat. Summary  It is important for your health care providers to know whether or not you have sleep apnea, especially if you are having surgery.  If you have sleep apnea, you are at an increased risk for complications during surgery.  You and your health care provider can take precautions to help prevent complications.  If you have sleep apnea, make sure to tell your health care provider and anesthesia specialist. This information is not intended to replace advice given to you by your health care provider. Make sure you discuss any questions you have with your health care provider. Document Revised: 04/24/2018 Document Reviewed: 04/18/2016 Elsevier Patient Education  [employer].

## 2019-03-10 NOTE — Anesthesia Postprocedure Evaluation (Signed)
Anesthesia Post Note  Patient: Film/video editor  Procedure(s) Performed: RIGHT KNEE ARTHROSCOPY WITH PARTIAL MEDIAL MENISCECTOMY (Right Knee) CHONDROPLASTY (Right Knee)     Patient location during evaluation: PACU Anesthesia Type: General Level of consciousness: awake and alert Pain management: pain level controlled Vital Signs Assessment: post-procedure vital signs reviewed and stable Respiratory status: spontaneous breathing, nonlabored ventilation and respiratory function stable Cardiovascular status: blood pressure returned to baseline and stable Postop Assessment: no apparent nausea or vomiting Anesthetic complications: no    Last Vitals:  Vitals:   03/10/19 0902 03/10/19 0915  BP: 130/79 (!) 141/88  Pulse: 79 77  Resp:  16  Temp:  36.6 C  SpO2: 100% 99%    Last Pain:  Vitals:   03/10/19 0915  TempSrc: Oral  PainSc: 4                  Lynda Rainwater

## 2019-03-10 NOTE — Transfer of Care (Signed)
Immediate Anesthesia Transfer of Care Note  Patient: Harold Brown  Procedure(s) Performed: RIGHT KNEE ARTHROSCOPY WITH PARTIAL MEDIAL MENISCECTOMY (Right Knee) CHONDROPLASTY (Right Knee)  Patient Location: PACU  Anesthesia Type:General  Level of Consciousness: drowsy  Airway & Oxygen Therapy: Patient Spontanous Breathing and Patient connected to face mask oxygen  Post-op Assessment: Report given to RN and Post -op Vital signs reviewed and stable  Post vital signs: Reviewed and stable  Last Vitals:  Vitals Value Taken Time  BP 129/88 03/10/19 0815  Temp    Pulse 78 03/10/19 0818  Resp 14 03/10/19 0818  SpO2 100 % 03/10/19 0818  Vitals shown include unvalidated device data.  Last Pain:  Vitals:   03/10/19 0637  TempSrc: Oral  PainSc: 1       Patients Stated Pain Goal: 1 ([id number])  Complications: No apparent anesthesia complications

## 2019-03-10 NOTE — H&P (Signed)
PREOPERATIVE H&P  Chief Complaint: right knee medial meniscal tear  HPI: Harold Brown is a 64 y.o. male who presents for surgical treatment of right knee medial meniscal tear.  He denies any changes in medical history.  Past Medical History:  Diagnosis Date  . Allergy   . Anxiety   . Arthritis   . Depression   . GERD (gastroesophageal reflux disease)   . HLD (hyperlipidemia)   . Hypertension   . Sleep apnea   . Sleep apnea with use of continuous positive airway pressure (CPAP)    uses CPAP nightly   Past Surgical History:  Procedure Laterality Date  . BICEPS TENDON REPAIR Right 11/2015   upper  . broken finger Right    Bone fusion from the broken finger  . KNEE ARTHROSCOPY WITH MEDIAL MENISECTOMY Left 01/01/2019   Procedure: LEFT KNEE ARTHROSCOPY WITH PARTIAL MEDIAL AND LATERAL MENISCECTOMY;  Surgeon: Leandrew Koyanagi, MD;  Location: [facility];  Service: Orthopedics;  Laterality: Left;  . ROTATOR CUFF REPAIR Right 2013, 2017   Social History   Socioeconomic History  . Marital status: Married    Spouse name: Earley Abide  . Number of children: 0  . Years of education: Not on file  . Highest education level: Not on file  Occupational History  . Occupation: retired  Tobacco Use  . Smoking status: Never Smoker  . Smokeless tobacco: Never Used  Substance and Sexual Activity  . Alcohol use: Yes    Comment: 2-3 a day/ 2-3 times per [facility]  . Drug use: Never  . Sexual activity: Yes  Other Topics Concern  . Not on file  Social History Narrative  . Not on file   Social Determinants of Health   Financial Resource Strain:   . Difficulty of Paying Living Expenses: Not on file  Food Insecurity:   . Worried About Charity fundraiser in the Last Year: Not on file  . Ran Out of Food in the Last Year: Not on file  Transportation Needs:   . Lack of Transportation (Medical): Not on file  . Lack of Transportation (Non-Medical): Not on file  Physical  Activity:   . Days of Exercise per Week: Not on file  . Minutes of Exercise per Session: Not on file  Stress:   . Feeling of Stress : Not on file  Social Connections:   . Frequency of Communication with Friends and Family: Not on file  . Frequency of Social Gatherings with Friends and Family: Not on file  . Attends Religious Services: Not on file  . Active Member of Clubs or Organizations: Not on file  . Attends Archivist Meetings: Not on file  . Marital Status: Not on file   Family History  Problem Relation Age of Onset  . Arthritis Mother   . Breast cancer Mother   . Diabetes Father   . Heart disease Father   . Colon cancer Neg Hx   . Esophageal cancer Neg Hx   . Inflammatory bowel disease Neg Hx   . Liver disease Neg Hx   . Pancreatic cancer Neg Hx   . Rectal cancer Neg Hx   . Stomach cancer Neg Hx    Allergies  Allergen Reactions  . Erythromycin Rash   Prior to Admission medications   Medication Sig Start Date End Date Taking? Authorizing Provider  ALPRAZolam (XANAX) 0.5 MG tablet TAKE 1 TABLET BY MOUTH TWICE A DAY AS NEEDED FOR ANXIETY  12/02/18  Yes [place], Betty G, MD  amLODipine-olmesartan (AZOR) 5-40 MG tablet TAKE 1 TABLET BY MOUTH EVERY DAY 12/02/18  Yes [place], Betty G, MD  Ascorbic Acid (VITAMIN C ADULT GUMMIES PO) Take by mouth 2 (two) times daily.   Yes [provider]  Betamethasone Valerate 0.12 % foam APPLY 1 APPLICATION TOPICALLY 2 (TWO) TIMES DAILY AS NEEDED. 12/29/18  Yes [place], Betty G, MD  diclofenac sodium (VOLTAREN) 1 % GEL Apply 2 g topically 4 (four) times daily. 10/29/18  Yes Leandrew Koyanagi, MD  ketoconazole (NIZORAL) 2 % shampoo APPLY 1 APPLICATION TOPICALLY 2 (TWO) TIMES A WEEK. 02/22/19  Yes [place], Betty G, MD  metoprolol succinate (TOPROL-XL) 100 MG 24 hr tablet TAKE 1 TABLET BY MOUTH EVERY DAY WITH OR IMMEDIATELY FOLLOWING A MEAL 10/19/18  Yes [place], Betty G, MD  metroNIDAZOLE (METROGEL) 0.75 % gel  01/29/18  Yes [provider]  Multiple Vitamins-Minerals (HM MULTIVITAMIN ADULT GUMMY PO) Take by mouth 2 (two) times daily.   Yes [provider]  omeprazole (PRILOSEC) 40 MG capsule TAKE 1 CAPSULE BY MOUTH EVERY DAY 10/20/18  Yes Mansouraty, Telford Nab., MD  sertraline (ZOLOFT) 50 MG tablet Take 1 tablet (50 mg total) by mouth daily. 03/01/19  Yes [place], Betty G, MD  terbinafine (LAMISIL) 250 MG tablet Take 1 tablet (250 mg total) by mouth daily. 03/02/19 05/25/19 Yes [place], Betty G, MD     Positive ROS: All other systems have been reviewed and were otherwise negative with the exception of those mentioned in the HPI and as above.  Physical Exam: General: Alert, no acute distress Cardiovascular: No pedal edema Respiratory: No cyanosis, no use of accessory musculature GI: abdomen soft Skin: No lesions in the area of chief complaint Neurologic: Sensation intact distally Psychiatric: Patient is competent for consent with normal mood and affect Lymphatic: no lymphedema  MUSCULOSKELETAL: exam stable  Assessment: right knee medial meniscal tear  Plan: Plan for Procedure(s): RIGHT KNEE ARTHROSCOPY WITH PARTIAL MEDIAL MENISCECTOMY  The risks benefits and alternatives were discussed with the patient including but not limited to the risks of nonoperative treatment, versus surgical intervention including infection, bleeding, nerve injury,  blood clots, cardiopulmonary complications, morbidity, mortality, among others, and they were willing to proceed.   Eduard Roux, MD   03/10/2019 7:15 AM

## 2019-03-10 NOTE — Op Note (Signed)
   Surgery Date: 03/10/2019  Surgeon(s): Leandrew Koyanagi, MD  ASSIST: Madalyn Rob, [place]; necessary for the timely completion of procedure and due to complexity of procedure.  ANESTHESIA:  general  FLUIDS: Per anesthesia record.   ESTIMATED BLOOD LOSS: minimal  PREOPERATIVE DIAGNOSES:  1. Right knee acute complex medial meniscus tear 2. Right knee moderate synovitis  POSTOPERATIVE DIAGNOSES:  same  PROCEDURES PERFORMED:  1. Right knee arthroscopy with major synovectomy 2. Right knee arthroscopy with arthroscopic partial medial meniscectomy 3. Rigth knee arthroscopy with arthroscopic chondroplasty medial femoral condyle and trochlea and patella.  DESCRIPTION OF PROCEDURE: Harold Brown is a 64 y.o.-year-old male with right knee medial meniscus tear. Plans are to proceed with partial medial meniscectomy and diagnostic arthroscopy with debridement as indicated. Full discussion held regarding risks benefits alternatives and complications related surgical intervention. Conservative care options reviewed. All questions answered.  The patient was identified in the preoperative holding area and the operative extremity was marked. The patient was brought to the operating room and transferred to operating table in a supine position. Satisfactory general anesthesia was induced by anesthesiology.    Standard anterolateral, anteromedial arthroscopy portals were obtained. The anteromedial portal was obtained with a spinal needle for localization under direct visualization with subsequent diagnostic findings.   Incisions were made for arthroscopic knee portals.  Diagnostic knee arthroscopy was first performed and we encountered moderate synovitis throughout the knee joint.  Major synovectomy was performed with oscillating shaver.  Once this was done the arthroscope was positioned in the medial compartment and a gentle valgus stress was placed on the knee in order to evaluate the medial  meniscus.  We we encountered a complex tear of the posterior horn with a radial component and horizontal cleavage component.  Partial medial meniscectomy was performed with a meniscus basket and oscillating shaver back to a stable rim.  Gentle chondroplasty was performed for the medial femoral condyle.  The meniscal root was intact.  Cruciates were stable.  Lateral compartment exhibited grade I chondromalacia.  No lateral meniscal pathology.  The knee was then placed into full extension and arthroscope was repositioned in the patellofemoral compartment.  No loose bodies were present.  Gentle chondroplasty was performed for the patella surface and the femoral trochlea.  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  Excess fluid was removed from the knee joint.  Incisions were closed with interrupted nylon sutures.  Sterile dressings were applied.  Patient tolerated procedure well had no immediate complications.  Suprapatellar pouch and gutters: moderate synovitis or debris. Patella chondral surface: Grade 2 Trochlear chondral surface: Grade 4 Patellofemoral tracking: normal Medial meniscus: complex tear.  Medial femoral condyle weight bearing surface: Grade 2 Medial tibial plateau: Grade 2 Anterior cruciate ligament:stable Posterior cruciate ligament:stable Lateral meniscus: normal.   Lateral femoral condyle weight bearing surface: Grade 1 Lateral tibial plateau: Grade 1  DISPOSITION: The patient was awakened from general anesthetic, extubated, taken to the recovery room in medically stable condition, no apparent complications. The patient may be weightbearing as tolerated to the operative lower extremity.  Range of motion of right knee as tolerated.  Harold Cecil, MD [facility] 8:05 AM

## 2019-03-10 NOTE — Anesthesia Procedure Notes (Signed)
Procedure Name: LMA Insertion Date/Time: 03/10/2019 7:28 AM Performed by: Gwyndolyn Saxon, CRNA Pre-anesthesia Checklist: Patient identified, Emergency Drugs available, Suction available and Patient being monitored Patient Re-evaluated:Patient Re-evaluated prior to induction Oxygen Delivery Method: Circle system utilized Preoxygenation: Pre-oxygenation with 100% oxygen Induction Type: IV induction Ventilation: Mask ventilation without difficulty LMA: LMA inserted LMA Size: 4.0 Number of attempts: 1 Placement Confirmation: positive ETCO2 and breath sounds checked- equal and bilateral Tube secured with: Tape Dental Injury: Teeth and Oropharynx as per pre-operative assessment

## 2019-03-10 NOTE — Anesthesia Preprocedure Evaluation (Signed)
Anesthesia Evaluation  Patient identified by MRN, date of birth, ID band Patient awake    Reviewed: Allergy & Precautions, NPO status , Patient's Chart, lab work & pertinent test results, reviewed documented beta blocker date and time   Airway Mallampati: II  TM Distance: >3 FB Neck ROM: Full    Dental  (+) Teeth Intact   Pulmonary sleep apnea and Continuous Positive Airway Pressure Ventilation ,    Pulmonary exam normal breath sounds clear to auscultation       Cardiovascular hypertension, Pt. on medications and Pt. on home beta blockers Normal cardiovascular exam Rhythm:Regular Rate:Normal     Neuro/Psych PSYCHIATRIC DISORDERS Anxiety Depression negative neurological ROS     GI/Hepatic Neg liver ROS, GERD  Medicated and Controlled,  Endo/Other  Hyperlipidemia  Renal/GU negative Renal ROS  negative genitourinary   Musculoskeletal  (+) Arthritis , Osteoarthritis,  Left Medial Meniscus tear   Abdominal (+) - obese,   Peds  Hematology negative hematology ROS (+)   Anesthesia Other Findings   Reproductive/Obstetrics                             Anesthesia Physical  Anesthesia Plan  ASA: II  Anesthesia Plan: General   Post-op Pain Management:    Induction: Intravenous  PONV Risk Score and Plan: 2 and Midazolam, Ondansetron and Treatment may vary due to age or medical condition  Airway Management Planned: LMA  Additional Equipment:   Intra-op Plan:   Post-operative Plan: Extubation in OR  Informed Consent: I have reviewed the patients History and Physical, chart, labs and discussed the procedure including the risks, benefits and alternatives for the proposed anesthesia with the patient or authorized representative who has indicated his/her understanding and acceptance.     Dental advisory given  Plan Discussed with: CRNA and Surgeon  Anesthesia Plan Comments:          Anesthesia Quick Evaluation

## 2019-03-11 ENCOUNTER — Encounter: Payer: Self-pay | Admitting: *Deleted

## 2019-03-17 ENCOUNTER — Encounter: Payer: Self-pay | Admitting: Physician Assistant

## 2019-03-17 ENCOUNTER — Other Ambulatory Visit: Payer: Self-pay

## 2019-03-17 ENCOUNTER — Ambulatory Visit (INDEPENDENT_AMBULATORY_CARE_PROVIDER_SITE_OTHER): Payer: BLUE CROSS/BLUE SHIELD | Admitting: Physician Assistant

## 2019-03-17 DIAGNOSIS — Z9889 Other specified postprocedural states: Secondary | ICD-10-CM

## 2019-03-17 NOTE — Progress Notes (Signed)
Post-Op Visit Note   Patient: Harold Brown           Date of Birth: [birth year]           MRN: [id number] Visit Date: 03/17/2019 PCP: Martinique, Betty G, MD   Assessment & Plan:  Chief Complaint: No chief complaint on file.  Visit Diagnoses:  1. S/P right knee arthroscopy     Plan: Patient is a pleasant 64 year old gentleman who comes in today 1 week out right knee arthroscopic debridement medial meniscus and chondroplasty date of surgery 03/10/2019.  It was noted during operative intervention that he had grade 4 changes to the trochlear chondral surface as well as grade 2 changes to the medial compartment.  He has actually been doing quite well.  Minimal to no pain.  He has been walking for exercise.  Examination of his right knee reveals mild swelling.  He has well healing surgical portals with nylon sutures in place.  No evidence of infection or cellulitis.  Calf is soft nontender.  He is neurovascular intact distally.  At this point, he will continue to advance with activity as tolerated.  He will follow-up with [place] in 5 weeks time for recheck.  Call with concerns or questions in meantime.  Follow-Up Instructions: Return in about 5 weeks (around 04/21/2019).   Orders:  No orders of the defined types were placed in this encounter.  No orders of the defined types were placed in this encounter.   Imaging: No new imaging  PMFS History: Patient Active Problem List   Diagnosis Date Noted  . Synovitis of knee 03/10/2019  . Idiopathic esophageal varices without bleeding ([place]) 03/01/2019  . Gastroesophageal reflux disease with esophagitis without hemorrhage 12/04/2018  . Acute medial meniscus tear of right knee 11/27/2018  . Acute medial meniscus tear of left knee 11/27/2018  . Hyperlipidemia, mixed 03/06/2018  . Periumbilical pain 123456  . History of colonic polyps 01/27/2018  . Cystic acne vulgaris 11/03/2017  . Insomnia 11/03/2017  . GAD (generalized anxiety disorder)  11/03/2017  . Hypertension, essential, benign 11/03/2017  . Bilateral primary osteoarthritis of knee 09/11/2017   Past Medical History:  Diagnosis Date  . Allergy   . Anxiety   . Arthritis   . Depression   . GERD (gastroesophageal reflux disease)   . HLD (hyperlipidemia)   . Hypertension   . Sleep apnea   . Sleep apnea with use of continuous positive airway pressure (CPAP)    uses CPAP nightly    Family History  Problem Relation Age of Onset  . Arthritis Mother   . Breast cancer Mother   . Diabetes Father   . Heart disease Father   . Colon cancer Neg Hx   . Esophageal cancer Neg Hx   . Inflammatory bowel disease Neg Hx   . Liver disease Neg Hx   . Pancreatic cancer Neg Hx   . Rectal cancer Neg Hx   . Stomach cancer Neg Hx     Past Surgical History:  Procedure Laterality Date  . BICEPS TENDON REPAIR Right 11/2015   upper  . broken finger Right    Bone fusion from the broken finger  . CHONDROPLASTY Right 03/10/2019   Procedure: CHONDROPLASTY;  Surgeon: Leandrew Koyanagi, MD;  Location: [facility];  Service: Orthopedics;  Laterality: Right;  . KNEE ARTHROSCOPY WITH MEDIAL MENISECTOMY Left 01/01/2019   Procedure: LEFT KNEE ARTHROSCOPY WITH PARTIAL MEDIAL AND LATERAL MENISCECTOMY;  Surgeon: Leandrew Koyanagi, MD;  Location:  [facility];  Service: Orthopedics;  Laterality: Left;  . KNEE ARTHROSCOPY WITH MEDIAL MENISECTOMY Right 03/10/2019   Procedure: RIGHT KNEE ARTHROSCOPY WITH PARTIAL MEDIAL MENISCECTOMY;  Surgeon: Leandrew Koyanagi, MD;  Location: [facility];  Service: Orthopedics;  Laterality: Right;  . ROTATOR CUFF REPAIR Right 2013, 2017   Social History   Occupational History  . Occupation: retired  Tobacco Use  . Smoking status: Never Smoker  . Smokeless tobacco: Never Used  Substance and Sexual Activity  . Alcohol use: Yes    Comment: 2-3 a day/ 2-3 times per [facility]  . Drug use: Never  . Sexual activity: Yes

## 2019-03-20 ENCOUNTER — Other Ambulatory Visit: Payer: Self-pay | Admitting: Family Medicine

## 2019-03-20 DIAGNOSIS — L219 Seborrheic dermatitis, unspecified: Secondary | ICD-10-CM

## 2019-03-25 ENCOUNTER — Ambulatory Visit: Payer: BLUE CROSS/BLUE SHIELD | Admitting: Orthopaedic Surgery

## 2019-03-28 ENCOUNTER — Other Ambulatory Visit: Payer: Self-pay | Admitting: Family Medicine

## 2019-03-28 ENCOUNTER — Other Ambulatory Visit: Payer: Self-pay | Admitting: Gastroenterology

## 2019-03-29 NOTE — Telephone Encounter (Signed)
Last OV: 03/01/19 Next OV: 08/30/19 Rx last filled: 03/01/19 Med Contract: not on file

## 2019-04-23 ENCOUNTER — Other Ambulatory Visit: Payer: Self-pay

## 2019-04-26 ENCOUNTER — Other Ambulatory Visit (INDEPENDENT_AMBULATORY_CARE_PROVIDER_SITE_OTHER): Payer: BLUE CROSS/BLUE SHIELD

## 2019-04-26 ENCOUNTER — Other Ambulatory Visit: Payer: Self-pay

## 2019-04-26 DIAGNOSIS — E782 Mixed hyperlipidemia: Secondary | ICD-10-CM

## 2019-04-26 DIAGNOSIS — Z1159 Encounter for screening for other viral diseases: Secondary | ICD-10-CM

## 2019-04-26 DIAGNOSIS — I10 Essential (primary) hypertension: Secondary | ICD-10-CM | POA: Diagnosis not present

## 2019-04-26 DIAGNOSIS — Z9189 Other specified personal risk factors, not elsewhere classified: Secondary | ICD-10-CM | POA: Diagnosis not present

## 2019-04-26 DIAGNOSIS — Z125 Encounter for screening for malignant neoplasm of prostate: Secondary | ICD-10-CM | POA: Diagnosis not present

## 2019-04-26 LAB — COMPREHENSIVE METABOLIC PANEL
ALT: 18 U/L (ref 0–53)
AST: 20 U/L (ref 0–37)
Albumin: 4.8 g/dL (ref 3.5–5.2)
Alkaline Phosphatase: 78 U/L (ref 39–117)
BUN: 14 mg/dL (ref 6–23)
CO2: 27 mEq/L (ref 19–32)
Calcium: 10.1 mg/dL (ref 8.4–10.5)
Chloride: 103 mEq/L (ref 96–112)
Creatinine, Ser: 1.04 mg/dL (ref 0.40–1.50)
GFR: 72.17 mL/min (ref >60.00)
Glucose, Bld: 113 mg/dL — ABNORMAL HIGH (ref 70–99)
Potassium: 3.9 mEq/L (ref 3.5–5.1)
Sodium: 141 mEq/L (ref 135–145)
Total Bilirubin: 0.8 mg/dL (ref 0.2–1.2)
Total Protein: 7.2 g/dL (ref 6.0–8.3)

## 2019-04-26 LAB — LIPID PANEL
Cholesterol: 261 mg/dL — ABNORMAL HIGH (ref 0–200)
HDL: 64.5 mg/dL (ref >39.00)
LDL Cholesterol: 161 mg/dL — ABNORMAL HIGH (ref 0–99)
NonHDL: 196.08
Total CHOL/HDL Ratio: 4
Triglycerides: 177 mg/dL — ABNORMAL HIGH (ref 0.0–149.0)
VLDL: 35.4 mg/dL (ref 0.0–40.0)

## 2019-04-26 LAB — PSA: PSA: 1.25 ng/mL (ref 0.10–4.00)

## 2019-04-27 LAB — HEPATITIS C ANTIBODY
Hepatitis C Ab: NONREACTIVE
SIGNAL TO CUT-OFF: 0.01 (ref <1.00)

## 2019-04-28 ENCOUNTER — Other Ambulatory Visit: Payer: Self-pay | Admitting: Family Medicine

## 2019-05-05 ENCOUNTER — Other Ambulatory Visit: Payer: Self-pay | Admitting: *Deleted

## 2019-05-05 MED ORDER — ATORVASTATIN CALCIUM 20 MG PO TABS: 20.0000 mg | ORAL_TABLET | Freq: Every day | ORAL | 3 refills | Status: DC

## 2019-05-20 DIAGNOSIS — L281 Prurigo nodularis: Secondary | ICD-10-CM | POA: Diagnosis not present

## 2019-05-20 DIAGNOSIS — L309 Dermatitis, unspecified: Secondary | ICD-10-CM | POA: Diagnosis not present

## 2019-06-25 ENCOUNTER — Other Ambulatory Visit: Payer: Self-pay

## 2019-06-28 ENCOUNTER — Ambulatory Visit (INDEPENDENT_AMBULATORY_CARE_PROVIDER_SITE_OTHER): Payer: BLUE CROSS/BLUE SHIELD | Admitting: *Deleted

## 2019-06-28 ENCOUNTER — Other Ambulatory Visit: Payer: Self-pay

## 2019-06-28 ENCOUNTER — Other Ambulatory Visit: Payer: Self-pay | Admitting: Family Medicine

## 2019-06-28 DIAGNOSIS — Z23 Encounter for immunization: Secondary | ICD-10-CM

## 2019-06-28 DIAGNOSIS — I10 Essential (primary) hypertension: Secondary | ICD-10-CM

## 2019-06-28 NOTE — Progress Notes (Signed)
Per orders of Dr. Martinique, injection of 2nd Shingles Vaccine  given by Zacarias Pontes. Patient tolerated injection well.

## 2019-08-02 ENCOUNTER — Other Ambulatory Visit: Payer: Self-pay | Admitting: Family Medicine

## 2019-08-02 ENCOUNTER — Other Ambulatory Visit: Payer: Self-pay | Admitting: Gastroenterology

## 2019-08-02 DIAGNOSIS — Z23 Encounter for immunization: Secondary | ICD-10-CM | POA: Diagnosis not present

## 2019-08-03 NOTE — Telephone Encounter (Signed)
Rx last filled 07/05/19, next OV 08/30/19.

## 2019-08-06 ENCOUNTER — Telehealth: Payer: Self-pay | Admitting: Family Medicine

## 2019-08-06 MED ORDER — ATORVASTATIN CALCIUM 20 MG PO TABS: 20.0000 mg | ORAL_TABLET | Freq: Every day | ORAL | 3 refills | Status: DC

## 2019-08-06 NOTE — Addendum Note (Signed)
Addended by: Rodrigo Ran on: 08/06/2019 04:40 PM   Modules accepted: Orders

## 2019-08-06 NOTE — Telephone Encounter (Signed)
Rx sent in as requested. 

## 2019-08-06 NOTE — Telephone Encounter (Signed)
Pt is requesting a refill on Atorvastatin 20 mg tablet. Pt uses [facility].

## 2019-08-30 ENCOUNTER — Ambulatory Visit: Payer: BLUE CROSS/BLUE SHIELD | Admitting: Family Medicine

## 2019-09-03 ENCOUNTER — Ambulatory Visit: Payer: BLUE CROSS/BLUE SHIELD | Admitting: Family Medicine

## 2019-09-03 ENCOUNTER — Encounter: Payer: Self-pay | Admitting: Family Medicine

## 2019-09-03 ENCOUNTER — Other Ambulatory Visit: Payer: Self-pay

## 2019-09-03 VITALS — BP 126/80 | HR 64 | Temp 97.8°F | Resp 12 | Ht 72.0 in | Wt 179.5 lb

## 2019-09-03 DIAGNOSIS — I10 Essential (primary) hypertension: Secondary | ICD-10-CM

## 2019-09-03 DIAGNOSIS — N529 Male erectile dysfunction, unspecified: Secondary | ICD-10-CM | POA: Insufficient documentation

## 2019-09-03 DIAGNOSIS — F411 Generalized anxiety disorder: Secondary | ICD-10-CM

## 2019-09-03 DIAGNOSIS — R6882 Decreased libido: Secondary | ICD-10-CM | POA: Diagnosis not present

## 2019-09-03 DIAGNOSIS — R739 Hyperglycemia, unspecified: Secondary | ICD-10-CM

## 2019-09-03 DIAGNOSIS — E782 Mixed hyperlipidemia: Secondary | ICD-10-CM

## 2019-09-03 MED ORDER — TADALAFIL 20 MG PO TABS: 10.0000 mg | ORAL_TABLET | Freq: Every day | ORAL | 3 refills | Status: DC | PRN

## 2019-09-03 NOTE — Assessment & Plan Note (Signed)
Continue atorvastatin 20 mg daily and low-fat diet. We will adjust treatment, if needed, according to lipid panel results.

## 2019-09-03 NOTE — Assessment & Plan Note (Signed)
BP adequately controlled. No changes in current management. Continue monitoring BP regularly and low-salt diet. 

## 2019-09-03 NOTE — Patient Instructions (Signed)
A few things to remember from today's visit:   Hyperglycemia - Plan: Hemoglobin A1c  Hypertension, essential, benign - Plan: BASIC METABOLIC PANEL WITH GFR  Hyperlipidemia, mixed  Decreased libido - Plan: TSH, Testosterone  Cialis 20 mg added today, start with 1/2 tab daily as needed. No other change today.  If you need refills please call your pharmacy. Do not use My Chart to request refills or for acute issues that need immediate attention.    Please be sure medication list is accurate. If a new problem present, please set up appointment sooner than planned today.

## 2019-09-03 NOTE — Assessment & Plan Note (Signed)
We discussed possible etiologies as well as treatment options and some side effects. He would like to try Cialis. Started on Cialis 20 mg, he will try 1/2 tablet first and increase dose after daily. Caution with OTC meds or supplements. Further recommendation will be given according to lab results.

## 2019-09-03 NOTE — Assessment & Plan Note (Signed)
Problem is stable. Continue sertraline 50 mg daily and alprazolam 0.5 mg twice daily as needed.

## 2019-09-03 NOTE — Progress Notes (Signed)
HPI: [name] is a 64 y.o. male, who is here today for 6 months follow up.   He was last seen on 03/01/2019.  Anxiety: Currently he is on sertraline 50 mg daily and alprazolam 0.5 mg twice daily as needed. Probably has been a stable. He lives with his wife. Has been enjoying visits to the museum. Negative for depressed mood.  Hyperlipidemia: Last visit we started atorvastatin 20 mg daily. He has tolerated medication well, if he takes it in the morning with the rest of his medication.  Lab Results  Component Value Date   CHOL 261 (H) 04/26/2019   HDL 64.50 04/26/2019   LDLCALC 161 (H) 04/26/2019   LDLDIRECT 140.0 09/03/2017   TRIG 177.0 (H) 04/26/2019   CHOLHDL 4 04/26/2019   Glucose was elevated at 113 on 04/26/2019. No history of diabetes. Negative for polydipsia,polyuria, or polyphagia.  Hypertension: Currently he is on amlodipine-olmesartan 5-40 mg daily and metoprolol succinate 100 mg daily. He is checking BP regularly, he has been in normal range. Negative for frequent/severe headache, visual changes, CP, dyspnea, palpitations, focal neurologic deficit, or edema.  Lab Results  Component Value Date   CREATININE 1.04 04/26/2019   BUN 14 04/26/2019   NA 141 04/26/2019   K 3.9 04/26/2019   CL 103 04/26/2019   CO2 27 04/26/2019   ED: New problem that is started about a year ago. Decreased libido for about a while but getting worse. Problem does not happen every time. Erections do not last as long as they were before and not as strong as they use to be. He is taking OTC prime male, natural testosterone , for a year. He has noted some improvement. Denies anatomical abnormality. Negative for dysuria, gross hematuria, urine dribbling, or decreased urine output.  Lab Results  Component Value Date   HGBA1C 5.2 11/03/2017   Review of Systems  Constitutional: Negative for activity change, appetite change, fatigue and fever.  HENT: Negative for  nosebleeds, sore throat and trouble swallowing.   Respiratory: Negative for cough and wheezing.   Gastrointestinal: Negative for abdominal pain, nausea and vomiting.  Genitourinary: Negative for decreased urine volume, difficulty urinating and testicular pain.  Musculoskeletal: Negative for gait problem and myalgias.  Neurological: Negative for syncope, facial asymmetry and weakness.  Psychiatric/Behavioral: Negative for confusion. The patient is nervous/anxious.   Rest of ROS, see pertinent positives sand negatives in HPI  Current Outpatient Medications on File Prior to Visit  Medication Sig Dispense Refill  . ALPRAZolam (XANAX) 0.5 MG tablet TAKE 1 TABLET BY MOUTH TWICE A DAY AS NEEDED FOR ANXIETY 50 tablet 3  . amLODipine-olmesartan (AZOR) 5-40 MG tablet TAKE 1 TABLET BY MOUTH EVERY DAY 90 tablet 1  . amLODipine-olmesartan (AZOR) 5-40 MG tablet Take 1 tablet by mouth daily. 90 tablet 2  . Ascorbic Acid (VITAMIN C ADULT GUMMIES PO) Take by mouth 2 (two) times daily.    Marland Kitchen atorvastatin (LIPITOR) 20 MG tablet Take 1 tablet (20 mg total) by mouth daily. 90 tablet 3  . Betamethasone Valerate 0.12 % foam APPLY 1 APPLICATION TOPICALLY 2 (TWO) TIMES DAILY AS NEEDED. 100 g 1  . diclofenac sodium (VOLTAREN) 1 % GEL Apply 2 g topically 4 (four) times daily. 350 g 6  . HYDROcodone-acetaminophen (NORCO) 5-325 MG tablet Take 1-2 tablets by mouth 3 (three) times daily as needed. 20 tablet 0  . ketoconazole (NIZORAL) 2 % shampoo APPLY 1 APPLICATION TOPICALLY 2 (TWO) TIMES A WEEK. 120 mL 4  .  metoprolol succinate (TOPROL-XL) 100 MG 24 hr tablet TAKE 1 TABLET BY MOUTH EVERY DAY WITH OR IMMEDIATELY FOLLOWING A MEAL 90 tablet 2  . metroNIDAZOLE (METROGEL) 0.75 % gel     . Multiple Vitamins-Minerals (HM MULTIVITAMIN ADULT GUMMY PO) Take by mouth 2 (two) times daily.    Marland Kitchen omeprazole (PRILOSEC) 40 MG capsule TAKE 1 CAPSULE BY MOUTH EVERY DAY 90 capsule 1  . sertraline (ZOLOFT) 50 MG tablet Take 1 tablet (50 mg  total) by mouth daily. 90 tablet 2  . terbinafine (LAMISIL) 250 MG tablet TAKE 1 TABLET BY MOUTH EVERY DAY 84 tablet 0   No current facility-administered medications on file prior to visit.   Past Medical History:  Diagnosis Date  . Allergy   . Anxiety   . Arthritis   . Depression   . GERD (gastroesophageal reflux disease)   . HLD (hyperlipidemia)   . Hypertension   . Sleep apnea   . Sleep apnea with use of continuous positive airway pressure (CPAP)    uses CPAP nightly   Allergies  Allergen Reactions  . Erythromycin Rash    Social History   Socioeconomic History  . Marital status: Married    Spouse name: Harold Brown  . Number of children: 0  . Years of education: Not on file  . Highest education level: Not on file  Occupational History  . Occupation: retired  Tobacco Use  . Smoking status: Never Smoker  . Smokeless tobacco: Never Used  Vaping Use  . Vaping Use: Never used  Substance and Sexual Activity  . Alcohol use: Yes    Comment: 2-3 a day/ 2-3 times per [facility]  . Drug use: Never  . Sexual activity: Yes  Other Topics Concern  . Not on file  Social History Narrative  . Not on file   Social Determinants of Health   Financial Resource Strain:   . Difficulty of Paying Living Expenses: Not on file  Food Insecurity:   . Worried About Charity fundraiser in the Last Year: Not on file  . Ran Out of Food in the Last Year: Not on file  Transportation Needs:   . Lack of Transportation (Medical): Not on file  . Lack of Transportation (Non-Medical): Not on file  Physical Activity:   . Days of Exercise per Week: Not on file  . Minutes of Exercise per Session: Not on file  Stress:   . Feeling of Stress : Not on file  Social Connections:   . Frequency of Communication with Friends and Family: Not on file  . Frequency of Social Gatherings with Friends and Family: Not on file  . Attends Religious Services: Not on file  . Active Member of Clubs or Organizations:  Not on file  . Attends Archivist Meetings: Not on file  . Marital Status: Not on file    Vitals:   09/03/19 1106  BP: 126/80  Pulse: 64  Resp: 12  Temp: 97.8 F (36.6 C)  SpO2: 97%   Body mass index is 24.34 kg/m.  Physical Exam Nursing note reviewed.  Constitutional:      General: He is not in acute distress.    Appearance: He is well-developed.  HENT:     Head: Normocephalic and atraumatic.  Eyes:     Conjunctiva/sclera: Conjunctivae normal.     Pupils: Pupils are equal, round, and reactive to light.  Cardiovascular:     Rate and Rhythm: Normal rate and regular rhythm.  Pulses:          Dorsalis pedis pulses are 2+ on the right side and 2+ on the left side.     Heart sounds: No murmur heard.   Pulmonary:     Effort: Pulmonary effort is normal. No respiratory distress.     Breath sounds: Normal breath sounds.  Abdominal:     Palpations: Abdomen is soft. There is no hepatomegaly or mass.     Tenderness: There is no abdominal tenderness.  Lymphadenopathy:     Cervical: No cervical adenopathy.  Skin:    General: Skin is warm.     Findings: No erythema or rash.  Neurological:     Mental Status: He is alert and oriented to person, place, and time.     Cranial Nerves: No cranial nerve deficit.     Gait: Gait normal.  Psychiatric:     Comments: Well groomed, good eye contact.   ASSESSMENT AND PLAN:  [name] was seen today for 6 months follow-up.  Orders Placed This Encounter  Procedures  . Hemoglobin A1c  . BASIC METABOLIC PANEL WITH GFR  . TSH  . Testosterone  . Lipid panel   Lab Results  Component Value Date   CREATININE 0.87 09/03/2019   BUN 17 09/03/2019   NA 141 09/03/2019   K 3.8 09/03/2019   CL 104 09/03/2019   CO2 27 09/03/2019   Lab Results  Component Value Date   HGBA1C 5.5 09/03/2019   Lab Results  Component Value Date   CREATININE 0.87 09/03/2019   BUN 17 09/03/2019   NA 141 09/03/2019   K 3.8  09/03/2019   CL 104 09/03/2019   CO2 27 09/03/2019   Lab Results  Component Value Date   TESTOSTERONE 507 09/03/2019    Hyperglycemia Continue healthy lifestyle for primary prevention of diabetes. Further recommendation will be given according to A1c results.  Decreased libido We discussed possible causes. Further recommendations according to lab results.  Hypertension, essential, benign BP adequately controlled. No changes in current management. Continue monitoring BP regularly and low salt diet.  GAD (generalized anxiety disorder) Problem is stable. Continue sertraline 50 mg daily and alprazolam 0.5 mg twice daily as needed.  Hyperlipidemia, mixed Continue atorvastatin 20 mg daily and low-fat diet. We will adjust treatment, if needed, according to lipid panel results.  Erectile dysfunction We discussed possible etiologies as well as treatment options and some side effects. He would like to try Cialis. Started on Cialis 20 mg, he will try 1/2 tablet first and increase dose after daily. Caution with OTC meds or supplements. Further recommendation will be given according to lab results.   Return in about 6 months (around 03/05/2020) for cpe.   Harold Zuccaro G. Martinique, MD  [facility]. [place] office.  Hyperlipidemia, mixed  Decreased libido - Plan: TSH, Testosterone  Cialis 20 mg added today, start with 1/2 tab daily as needed. No other change today.  If you need refills please call your pharmacy. Do not use My Chart to request refills or for acute issues that need immediate attention.    Please be sure medication list is accurate. If a new problem present, please set up appointment sooner than planned today.

## 2019-09-04 LAB — BASIC METABOLIC PANEL WITH GFR
BUN: 17 mg/dL (ref 7–25)
CO2: 27 mmol/L (ref 20–32)
Calcium: 9.6 mg/dL (ref 8.6–10.3)
Chloride: 104 mmol/L (ref 98–110)
Creat: 0.87 mg/dL (ref 0.70–1.25)
GFR, Est African American: 106 mL/min/{1.73_m2} (ref >=60)
GFR, Est Non African American: 92 mL/min/{1.73_m2} (ref >=60)
Glucose, Bld: 107 mg/dL — ABNORMAL HIGH (ref 65–99)
Potassium: 3.8 mmol/L (ref 3.5–5.3)
Sodium: 141 mmol/L (ref 135–146)

## 2019-09-04 LAB — HEMOGLOBIN A1C
Hgb A1c MFr Bld: 5.5 % of total Hgb (ref <5.7)
Mean Plasma Glucose: 111 (calc)
eAG (mmol/L): 6.2 (calc)

## 2019-09-04 LAB — TESTOSTERONE: Testosterone: 507 ng/dL (ref 250–827)

## 2019-09-04 LAB — TSH: TSH: 2.33 mIU/L (ref 0.40–4.50)

## 2019-09-08 ENCOUNTER — Other Ambulatory Visit: Payer: BLUE CROSS/BLUE SHIELD

## 2019-09-08 ENCOUNTER — Other Ambulatory Visit: Payer: Self-pay

## 2019-09-08 DIAGNOSIS — E782 Mixed hyperlipidemia: Secondary | ICD-10-CM | POA: Diagnosis not present

## 2019-09-09 LAB — LIPID PANEL
Cholesterol: 162 mg/dL (ref <200)
HDL: 58 mg/dL (ref >=40)
LDL Cholesterol (Calc): 83 mg/dL (calc)
Non-HDL Cholesterol (Calc): 104 mg/dL (calc) (ref <130)
Total CHOL/HDL Ratio: 2.8 (calc) (ref <5.0)
Triglycerides: 113 mg/dL (ref <150)

## 2019-09-10 ENCOUNTER — Other Ambulatory Visit: Payer: Self-pay | Admitting: Family Medicine

## 2019-09-10 DIAGNOSIS — F411 Generalized anxiety disorder: Secondary | ICD-10-CM

## 2019-10-04 DIAGNOSIS — H16223 Keratoconjunctivitis sicca, not specified as Sjogren's, bilateral: Secondary | ICD-10-CM | POA: Diagnosis not present

## 2019-10-13 ENCOUNTER — Other Ambulatory Visit: Payer: Self-pay

## 2019-10-13 MED ORDER — AMLODIPINE-OLMESARTAN 5-40 MG PO TABS: 1.0000 | ORAL_TABLET | Freq: Every day | ORAL | 2 refills | Status: DC

## 2019-12-03 ENCOUNTER — Other Ambulatory Visit: Payer: Self-pay | Admitting: Family Medicine

## 2019-12-03 NOTE — Telephone Encounter (Signed)
Last filled 11/03/19

## 2020-01-09 ENCOUNTER — Other Ambulatory Visit: Payer: Self-pay | Admitting: Gastroenterology

## 2020-01-31 ENCOUNTER — Ambulatory Visit: Payer: BLUE CROSS/BLUE SHIELD

## 2020-02-03 ENCOUNTER — Other Ambulatory Visit: Payer: Self-pay | Admitting: Family Medicine

## 2020-02-07 ENCOUNTER — Other Ambulatory Visit: Payer: Self-pay

## 2020-02-07 ENCOUNTER — Ambulatory Visit (INDEPENDENT_AMBULATORY_CARE_PROVIDER_SITE_OTHER): Payer: BLUE CROSS/BLUE SHIELD

## 2020-02-07 DIAGNOSIS — Z23 Encounter for immunization: Secondary | ICD-10-CM

## 2020-02-07 NOTE — Progress Notes (Signed)
Per orders of Dr Martinique, injection of Influenza shot given by Armandina Gemma, cma.  Patient tolerated injection well.

## 2020-02-26 IMAGING — MR MR KNEE*R* W/O CM
4 of 6 series · 21 of 40 positions shown · non-contrast
Comparison: Radiographs dated 10/29/2018

CLINICAL DATA: Right knee pain and swelling.

EXAM:
MRI OF THE RIGHT KNEE WITHOUT CONTRAST
TECHNIQUE: Multiplanar, multisequence MR imaging of the knee was performed. No
intravenous contrast was administered.

[Series 3: T2 fat-sat · axial · 4.0mm · 0.50mm/px · z∈[-35,+65]mm · 3 of 26 slices shown (1 of 2)]
[im 6/26]
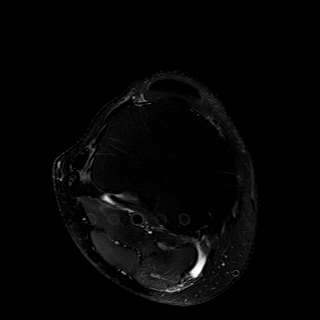
[im 16/26]
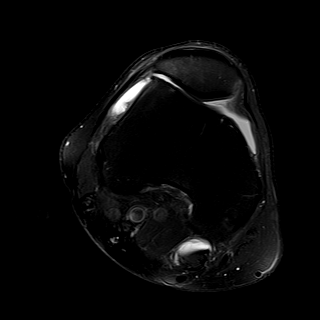
[im 26/26]
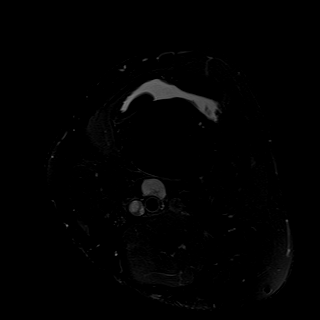

[Series 5: T2 fat-sat · coronal · 4.0mm · 0.29mm/px · 3 of 24 slices shown (2 of 2)]
[im 5/24]
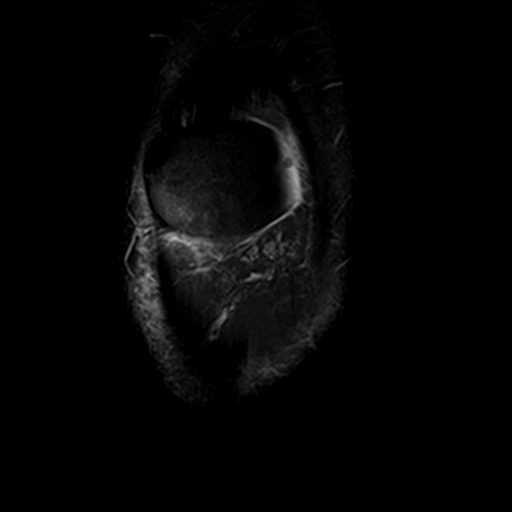
[im 14/24]
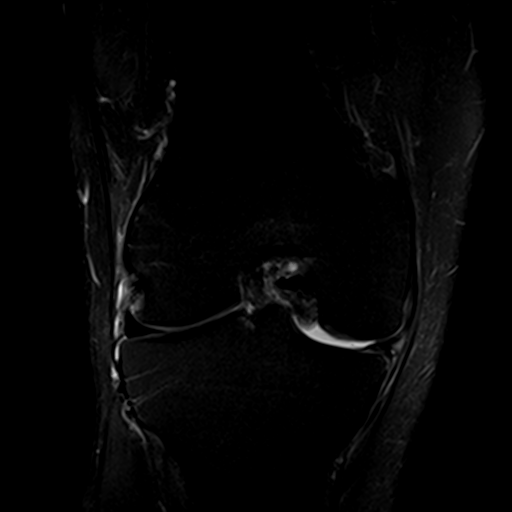
[im 24/24]
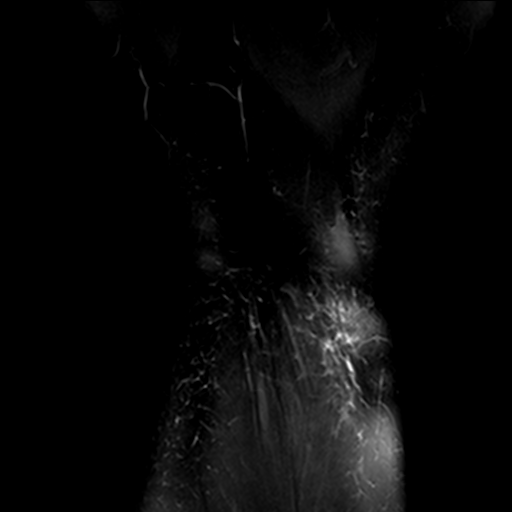

[Series 7: PD fat-sat · sagittal · 3.0mm · 0.29mm/px · 7 of 27 slices shown (1 of 2)]
[im 1/27]
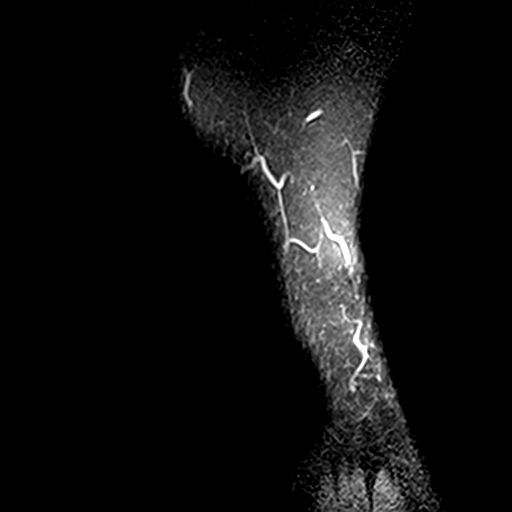
[im 5/27]
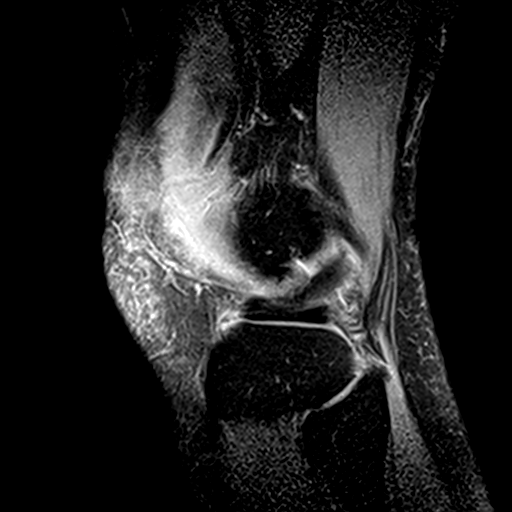
[im 9/27]
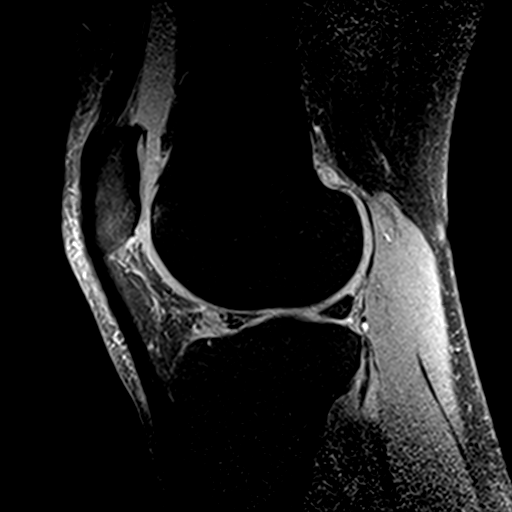
[im 14/27]
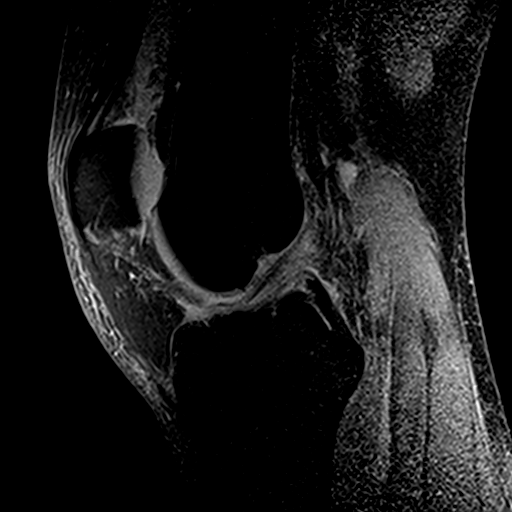
[im 18/27]
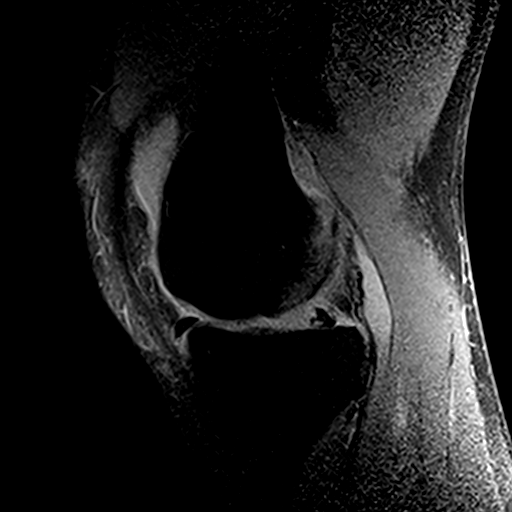
[im 22/27]
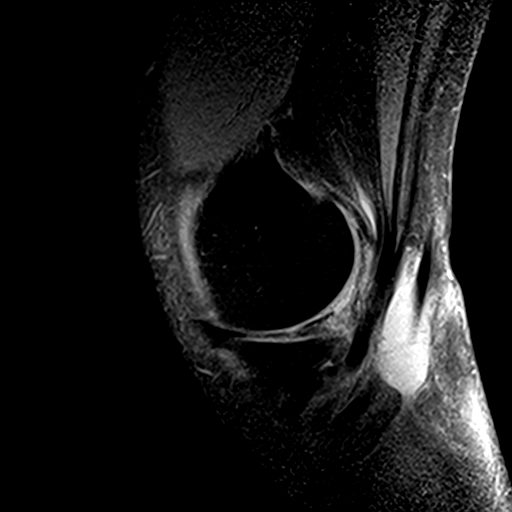
[im 27/27]
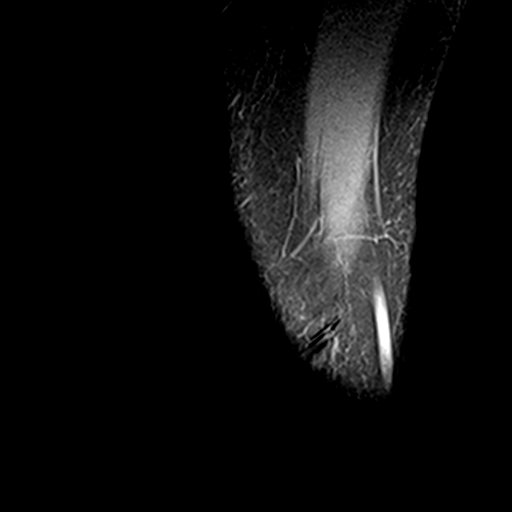

[Series 8: PD fat-sat · coronal · 3.0mm · 0.29mm/px · 8 of 30 slices shown (2 of 2)]
[im 1/30]
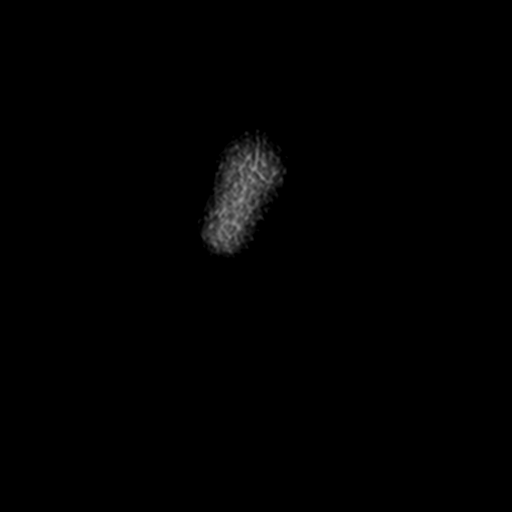
[im 5/30]
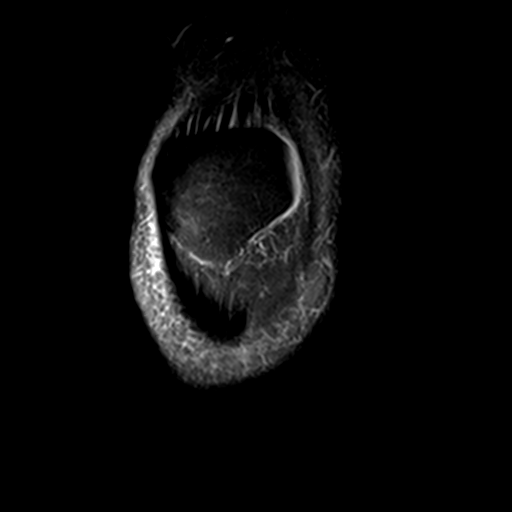
[im 9/30]
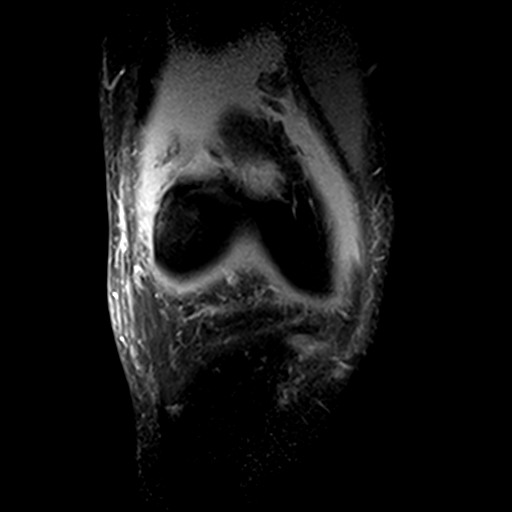
[im 13/30]
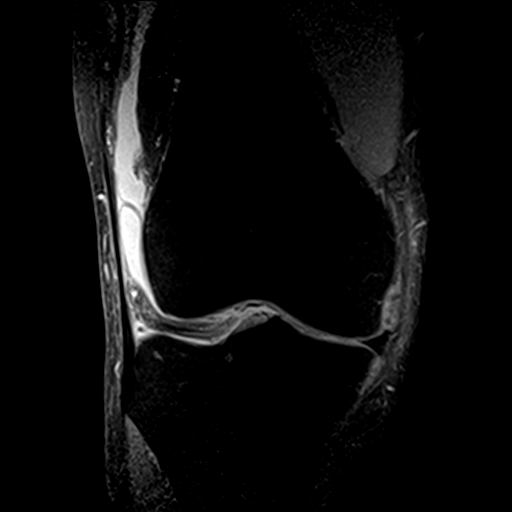
[im 17/30]
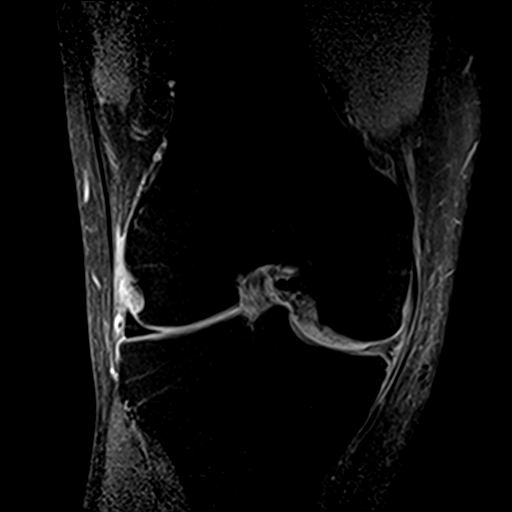
[im 21/30]
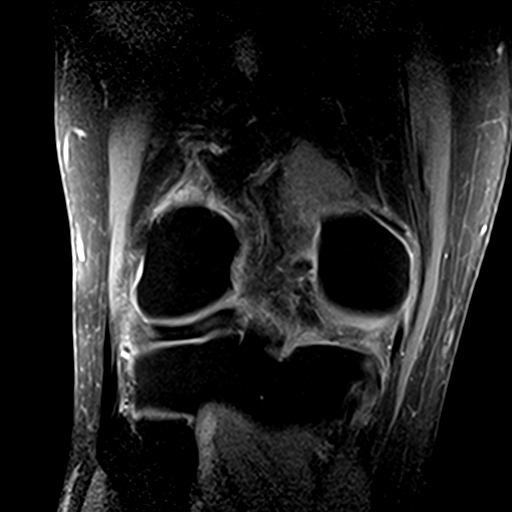
[im 25/30]
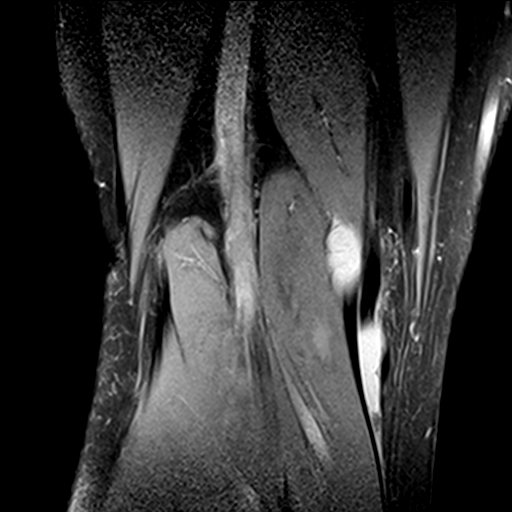
[im 30/30]
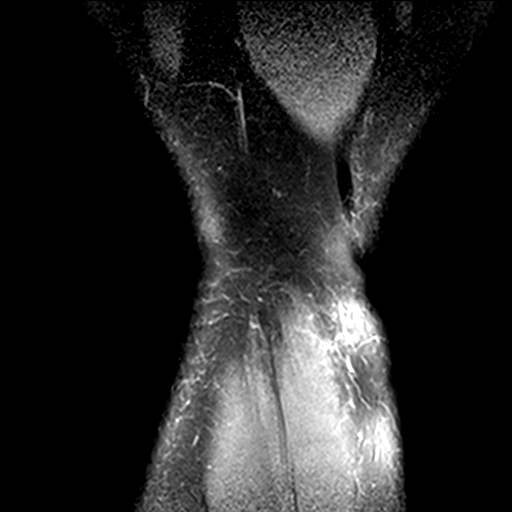

[21 of 40 positions shown; findings below may reference images not displayed]

FINDINGS: MENISCI

Medial meniscus: There is an irregular complex tear of the posterior
horn of the medial meniscus near the meniscal root.

Lateral meniscus:  Normal.

LIGAMENTS

Cruciates:  Normal.

Collaterals:  Normal.

CARTILAGE

Patellofemoral: Focal thinning of the articular cartilage of the
inferior aspect of the lateral facet the patella and of the adjacent
lateral aspect of the trochlear groove of the distal femur.

Medial: Focal irregular thinning of the posterior central aspect of
the femoral condyle and tibial plateau.

Lateral:  Normal.

Joint: Moderate joint effusion. Normal Hoffa's fat pad. No plical
thickening.

Popliteal Fossa: Small Baker's cyst. Slight leakage of joint fluid
from the Baker's cyst. Intact popliteus tendon.

Extensor Mechanism:  Normal.

Bones:  No significant abnormality.

Other: None
IMPRESSION: 1. Irregular complex tear of the posterior horn of the medial
meniscus near the meniscal root.
2. Moderate joint effusion with small Baker's cyst.
3. Chondromalacia of the patellofemoral and medial compartments.

## 2020-03-04 ENCOUNTER — Other Ambulatory Visit: Payer: Self-pay | Admitting: Gastroenterology

## 2020-04-01 ENCOUNTER — Other Ambulatory Visit: Payer: Self-pay | Admitting: Family Medicine

## 2020-04-03 NOTE — Telephone Encounter (Signed)
Last filled 03/04/20

## 2020-04-04 NOTE — Telephone Encounter (Signed)
Can you advise in pcp's absence? 

## 2020-04-06 NOTE — Telephone Encounter (Signed)
Patient is calling back to check the status of medication refill, please advise. CB is [phone]

## 2020-04-07 NOTE — Telephone Encounter (Signed)
Can you advise in pcp's absence?

## 2020-04-07 NOTE — Telephone Encounter (Signed)
I refilled once in primary provider's absence.

## 2020-05-08 ENCOUNTER — Other Ambulatory Visit: Payer: Self-pay | Admitting: Family Medicine

## 2020-05-08 NOTE — Telephone Encounter (Signed)
Martinique patient.  Last filled 04/07/2020 Last OV 09/03/2019

## 2020-05-09 NOTE — Telephone Encounter (Signed)
He was last seen in 08/2019. He was supposed to follow in 02/2020. He needs a f/u visit. Thanks, BJ

## 2020-05-16 ENCOUNTER — Ambulatory Visit (INDEPENDENT_AMBULATORY_CARE_PROVIDER_SITE_OTHER): Payer: BLUE CROSS/BLUE SHIELD | Admitting: Orthopaedic Surgery

## 2020-05-16 ENCOUNTER — Ambulatory Visit: Payer: Self-pay

## 2020-05-16 ENCOUNTER — Other Ambulatory Visit: Payer: Self-pay

## 2020-05-16 DIAGNOSIS — M25561 Pain in right knee: Secondary | ICD-10-CM

## 2020-05-16 DIAGNOSIS — G8929 Other chronic pain: Secondary | ICD-10-CM | POA: Diagnosis not present

## 2020-05-16 DIAGNOSIS — M25562 Pain in left knee: Secondary | ICD-10-CM | POA: Diagnosis not present

## 2020-05-16 MED ORDER — LIDOCAINE HCL 1 % IJ SOLN
3.0000 mL | INTRAMUSCULAR | Status: AC | PRN
  Administered 2020-05-16: 3 mL

## 2020-05-16 MED ORDER — BUPIVACAINE HCL 0.25 % IJ SOLN
0.6600 mL | INTRAMUSCULAR | Status: AC | PRN
  Administered 2020-05-16: .66 mL via INTRA_ARTICULAR

## 2020-05-16 MED ORDER — METHYLPREDNISOLONE ACETATE 40 MG/ML IJ SUSP
13.3300 mg | INTRAMUSCULAR | Status: AC | PRN
  Administered 2020-05-16: 13.33 mg via INTRA_ARTICULAR

## 2020-05-16 NOTE — Progress Notes (Signed)
Office Visit Note   Patient: Harold Brown           Date of Birth: 01-08-1956           MRN: [id number] Visit Date: 05/16/2020              Requested by: Martinique, Betty G, MD [address] [place],  [place] [address] PCP: Martinique, Betty G, MD   Assessment & Plan: Visit Diagnoses:  1. Chronic pain of both knees     Plan: Impression is bilateral knee osteoarthritis.  We discussed cortisone injections today for which he would like to proceed.  We also discussed viscosupplementation injections and a handout was provided.  He will follow-up with [place] as needed.  Follow-Up Instructions: Return if symptoms worsen or fail to improve.   Orders:  Orders Placed This Encounter  Procedures  . Large Joint Inj: bilateral knee  . XR KNEE 3 VIEW LEFT  . XR KNEE 3 VIEW RIGHT   No orders of the defined types were placed in this encounter.     Procedures: Large Joint Inj: bilateral knee on 05/16/2020 2:34 PM Indications: pain Details: 22 G needle, anterolateral approach Medications (Right): 0.66 mL bupivacaine 0.25 %; 3 mL lidocaine 1 %; 13.33 mg methylPREDNISolone acetate 40 MG/ML Medications (Left): 0.66 mL bupivacaine 0.25 %; 3 mL lidocaine 1 %; 13.33 mg methylPREDNISolone acetate 40 MG/ML      Clinical Data: No additional findings.   Subjective: Chief Complaint  Patient presents with  . Right Knee - Pain  . Left Knee - Pain    HPI pleasant 65 year old gentleman who comes in today with recurrent bilateral knee pain.  History of left knee arthroscopic debridement medial and lateral meniscus as well as chondroplasty.  It was noted during operative intervention he had grade 3 and 4 changes throughout.  He is also status post right knee arthroscopic debridement medial meniscus as well as chondroplasty.  It was noted that he had grade 2 changes throughout at that time.  He has recently been experiencing increased pain is primarily retropatellar.  He describes this as a constant  achiness walking greater than 2 miles.  He takes an occasional anti-inflammatory which does seem to help.  He has not had cortisone injection since surgical intervention.  Review of Systems as detailed in HPI.  All others reviewed and are negative.   Objective: Vital Signs: There were no vitals taken for this visit.  Physical Exam well-developed well-nourished gentleman in no acute distress.  Alert and oriented x3.  Ortho Exam bilateral knee exam shows no effusion in either side.  No joint line tenderness.  Moderate patellofemoral crepitus with sides.  Ligaments are stable.  He is neurovascular intact distally.  Specialty Comments:  No specialty comments available.  Imaging: XR KNEE 3 VIEW LEFT  Result Date: 05/16/2020 X-rays demonstrate mild to moderate degenerative changes all 3 compartments worse in the patellofemoral compartment   XR KNEE 3 VIEW RIGHT  Result Date: 05/16/2020 X-rays demonstrate mild to moderate degenerative changes all 3 compartments worse in the patellofemoral compartment    PMFS History: Patient Active Problem List   Diagnosis Date Noted  . Erectile dysfunction 09/03/2019  . Synovitis of knee 03/10/2019  . Idiopathic esophageal varices without bleeding ([place]) 03/01/2019  . Gastroesophageal reflux disease with esophagitis without hemorrhage 12/04/2018  . Acute medial meniscus tear of right knee 11/27/2018  . Acute medial meniscus tear of left knee 11/27/2018  . Hyperlipidemia, mixed 03/06/2018  . Periumbilical  pain 01/27/2018  . History of colonic polyps 01/27/2018  . Cystic acne vulgaris 11/03/2017  . Insomnia 11/03/2017  . GAD (generalized anxiety disorder) 11/03/2017  . Hypertension, essential, benign 11/03/2017  . Bilateral primary osteoarthritis of knee 09/11/2017   Past Medical History:  Diagnosis Date  . Allergy   . Anxiety   . Arthritis   . Depression   . GERD (gastroesophageal reflux disease)   . HLD (hyperlipidemia)   . Hypertension    . Sleep apnea   . Sleep apnea with use of continuous positive airway pressure (CPAP)    uses CPAP nightly    Family History  Problem Relation Age of Onset  . Arthritis Mother   . Breast cancer Mother   . Diabetes Father   . Heart disease Father   . Colon cancer Neg Hx   . Esophageal cancer Neg Hx   . Inflammatory bowel disease Neg Hx   . Liver disease Neg Hx   . Pancreatic cancer Neg Hx   . Rectal cancer Neg Hx   . Stomach cancer Neg Hx     Past Surgical History:  Procedure Laterality Date  . BICEPS TENDON REPAIR Right 11/2015   upper  . broken finger Right    Bone fusion from the broken finger  . CHONDROPLASTY Right 03/10/2019   Procedure: CHONDROPLASTY;  Surgeon: Leandrew Koyanagi, MD;  Location: [facility];  Service: Orthopedics;  Laterality: Right;  . KNEE ARTHROSCOPY WITH MEDIAL MENISECTOMY Left 01/01/2019   Procedure: LEFT KNEE ARTHROSCOPY WITH PARTIAL MEDIAL AND LATERAL MENISCECTOMY;  Surgeon: Leandrew Koyanagi, MD;  Location: [facility];  Service: Orthopedics;  Laterality: Left;  . KNEE ARTHROSCOPY WITH MEDIAL MENISECTOMY Right 03/10/2019   Procedure: RIGHT KNEE ARTHROSCOPY WITH PARTIAL MEDIAL MENISCECTOMY;  Surgeon: Leandrew Koyanagi, MD;  Location: [facility];  Service: Orthopedics;  Laterality: Right;  . ROTATOR CUFF REPAIR Right 2013, 2017   Social History   Occupational History  . Occupation: retired  Tobacco Use  . Smoking status: Never Smoker  . Smokeless tobacco: Never Used  Vaping Use  . Vaping Use: Never used  Substance and Sexual Activity  . Alcohol use: Yes    Comment: 2-3 a day/ 2-3 times per [facility]  . Drug use: Never  . Sexual activity: Yes

## 2020-06-27 ENCOUNTER — Ambulatory Visit: Payer: BLUE CROSS/BLUE SHIELD | Admitting: Family Medicine

## 2020-06-27 ENCOUNTER — Encounter: Payer: Self-pay | Admitting: Family Medicine

## 2020-06-27 ENCOUNTER — Other Ambulatory Visit: Payer: Self-pay

## 2020-06-27 VITALS — BP 122/80 | HR 67 | Resp 16 | Ht 72.0 in | Wt 179.0 lb

## 2020-06-27 DIAGNOSIS — F411 Generalized anxiety disorder: Secondary | ICD-10-CM | POA: Diagnosis not present

## 2020-06-27 DIAGNOSIS — R0602 Shortness of breath: Secondary | ICD-10-CM | POA: Diagnosis not present

## 2020-06-27 DIAGNOSIS — I10 Essential (primary) hypertension: Secondary | ICD-10-CM | POA: Diagnosis not present

## 2020-06-27 DIAGNOSIS — E782 Mixed hyperlipidemia: Secondary | ICD-10-CM

## 2020-06-27 DIAGNOSIS — L989 Disorder of the skin and subcutaneous tissue, unspecified: Secondary | ICD-10-CM

## 2020-06-27 MED ORDER — ALPRAZOLAM 0.5 MG PO TABS: 0.5000 mg | ORAL_TABLET | Freq: Two times a day (BID) | ORAL | 3 refills | Status: DC | PRN

## 2020-06-27 MED ORDER — NYSTATIN-TRIAMCINOLONE 100000-0.1 UNIT/GM-% EX CREA: 1.0000 "application " | TOPICAL_CREAM | Freq: Two times a day (BID) | CUTANEOUS | 0 refills | Status: AC | PRN

## 2020-06-27 NOTE — Progress Notes (Signed)
HPI: [name] is a 65 y.o. male, who is here today for follow up.   He was last seen on 09/03/19 Since his last visit he has seen Dr Erlinda Hong for knee pain.   HTN: He is on Metoprolol Succinate 100 mg daily and Amlodipine-Olmesartan 5-40 mg daily. Negative for severe/frequent headache, visual changes,focal weakness, or edema.  Home BP's:120's/80's.  Sometimes when he gets up to go to the bathroom at night when he goes back to bed he feels SOB. It has happened intermittently for about 2 months. No associated CP,palpitations,or diaphoresis.  No problem with activity/exertion during the day.  Lab Results  Component Value Date   CREATININE 0.87 09/03/2019   BUN 17 09/03/2019   NA 141 09/03/2019   K 3.8 09/03/2019   CL 104 09/03/2019   CO2 27 09/03/2019   He is requesting refills on terbinafine, which was prescribed to treat onychomycosis, completed 12 weeks treatment. He is concerned about minimal pruritic scaly lesions on scalp. He noted some improvement while he was on oral antimycotic treatment. He has had lesion for a few months, Nizoral shampoo has not helped nor topical steroid. He removes "white heads" to help control growth, states that if this touches healthy skin he has new lesions (LE's mainly). No associated fever,fatigue, or myalgias.  Nystatin-Triamcinolone helps some, he would like a refill. No sick contact,new meds,detergents,or skin product.  HLD: He is on Atorvastatin 20 mg daily. Tolerating medication well. Component     Latest Ref Rng & Units 09/08/2019  Cholesterol     0 - 200 mg/dL 162  HDL Cholesterol     >39.00 mg/dL 58  Triglycerides     0.0 - 149.0 mg/dL 113  LDL Cholesterol (Calc)     mg/dL (calc) 83  Total CHOL/HDL Ratio      2.8  Non-HDL Cholesterol (Calc)     <130 mg/dL (calc) 104   Anxiety: He is on Sertraline 50 mg daily and Alprazolam 0.5 mg bid prn. Medications are still helping with anxiety, no side effects  reported.  Review of Systems  Constitutional:  Negative for activity change, appetite change and fatigue.  HENT:  Negative for mouth sores, nosebleeds, sore throat and trouble swallowing.   Respiratory:  Negative for cough and wheezing.   Gastrointestinal:  Negative for abdominal pain, nausea and vomiting.  Genitourinary:  Negative for decreased urine volume and hematuria.  Neurological:  Negative for syncope, facial asymmetry and weakness.  Psychiatric/Behavioral:  Negative for confusion. The patient is nervous/anxious.   Rest of ROS, see pertinent positives sand negatives in HPI  Current Outpatient Medications on File Prior to Visit  Medication Sig Dispense Refill   amLODipine-olmesartan (AZOR) 5-40 MG tablet Take 1 tablet by mouth daily. 90 tablet 2   Ascorbic Acid (VITAMIN C ADULT GUMMIES PO) Take by mouth 2 (two) times daily.     atorvastatin (LIPITOR) 20 MG tablet Take 1 tablet (20 mg total) by mouth daily. 90 tablet 3   diclofenac sodium (VOLTAREN) 1 % GEL Apply 2 g topically 4 (four) times daily. 350 g 6   HYDROcodone-acetaminophen (NORCO) 5-325 MG tablet Take 1-2 tablets by mouth 3 (three) times daily as needed. 20 tablet 0   metoprolol succinate (TOPROL-XL) 100 MG 24 hr tablet TAKE 1 TABLET BY MOUTH EVERY DAY WITH OR IMMEDIATELY FOLLOWING A MEAL 90 tablet 2   metroNIDAZOLE (METROGEL) 0.75 % gel      Multiple Vitamins-Minerals (HM MULTIVITAMIN ADULT GUMMY PO) Take by mouth  2 (two) times daily.     omeprazole (PRILOSEC) 40 MG capsule TAKE 1 CAPSULE BY MOUTH EVERY DAY 90 capsule 0   sertraline (ZOLOFT) 50 MG tablet TAKE 1 TABLET BY MOUTH EVERY DAY 90 tablet 2   tadalafil (CIALIS) 20 MG tablet Take 0.5-1 tablets (10-20 mg total) by mouth daily as needed for erectile dysfunction. 20 tablet 3   No current facility-administered medications on file prior to visit.   Past Medical History:  Diagnosis Date   Allergy    Anxiety    Arthritis    Depression    GERD (gastroesophageal  reflux disease)    HLD (hyperlipidemia)    Hypertension    Sleep apnea    Sleep apnea with use of continuous positive airway pressure (CPAP)    uses CPAP nightly   Allergies  Allergen Reactions   Erythromycin Rash    Social History   Socioeconomic History   Marital status: Married    Spouse name: Harold Brown   Number of children: 0   Years of education: Not on file   Highest education level: Not on file  Occupational History   Occupation: retired  Tobacco Use   Smoking status: Never   Smokeless tobacco: Never  Vaping Use   Vaping Use: Never used  Substance and Sexual Activity   Alcohol use: Yes    Comment: 2-3 a day/ 2-3 times per [facility]   Drug use: Never   Sexual activity: Yes  Other Topics Concern   Not on file  Social History Narrative   Not on file   Social Determinants of Health   Financial Resource Strain: Not on file  Food Insecurity: Not on file  Transportation Needs: Not on file  Physical Activity: Not on file  Stress: Not on file  Social Connections: Not on file    Vitals:   06/27/20 1421  BP: 122/80  Pulse: 67  Resp: 16  SpO2: 99%   Wt Readings from Last 3 Encounters:  06/27/20 179 lb (81.2 kg)  09/03/19 179 lb 8 oz (81.4 kg)  03/10/19 182 lb 15.7 oz (83 kg)   Body mass index is 24.28 kg/m.  Physical Exam Vitals and nursing note reviewed.  Constitutional:      General: He is not in acute distress.    Appearance: He is well-developed.  HENT:     Head: Normocephalic and atraumatic.      Mouth/Throat:     Mouth: Mucous membranes are moist.  Eyes:     Conjunctiva/sclera: Conjunctivae normal.  Cardiovascular:     Rate and Rhythm: Normal rate and regular rhythm.     Pulses:          Dorsalis pedis pulses are 2+ on the right side and 2+ on the left side.     Heart sounds: No murmur heard. Pulmonary:     Effort: Pulmonary effort is normal. No respiratory distress.     Breath sounds: Normal breath sounds.  Abdominal:     Palpations:  Abdomen is soft. There is no hepatomegaly or mass.     Tenderness: There is no abdominal tenderness.  Lymphadenopathy:     Cervical: No cervical adenopathy.  Skin:    General: Skin is warm.     Findings: No erythema or rash.     Comments: On LE's scattered hyperpigmented post inflammatory lesion. No active rash. On parietal scalp there is an erythematous plaque with superficial excoriations (from scratching?). It is not tender, defined borders, not indurated.  Neurological:     Mental Status: He is alert and oriented to person, place, and time.     Cranial Nerves: No cranial nerve deficit.     Gait: Gait normal.  Psychiatric:     Comments: Well groomed, good eye contact.   ASSESSMENT AND PLAN:  Mr. Nafis Farnan was seen today for follow-up.  Orders Placed This Encounter  Procedures   DG Chest 2 View   Comprehensive metabolic panel   Lipid panel   Ambulatory referral to Dermatology   EKG 12-Lead   Lab Results  Component Value Date   CREATININE 0.90 06/27/2020   BUN 20 06/27/2020   NA 140 06/27/2020   K 3.4 (L) 06/27/2020   CL 103 06/27/2020   CO2 27 06/27/2020   Lab Results  Component Value Date   ALT 25 06/27/2020   AST 24 06/27/2020   ALKPHOS 74 06/27/2020   BILITOT 0.7 06/27/2020   Lab Results  Component Value Date   CHOL 170 06/27/2020   HDL 66.60 06/27/2020   LDLCALC 78 06/27/2020   LDLDIRECT 140.0 09/03/2017   TRIG 129.0 06/27/2020   CHOLHDL 3 06/27/2020   SOB (shortness of breath) We discussed possible etiologies. Hx and examination do not suggest a serious process. EKG today mild sinus bradycardia,normal axis, IVCD, voltage criteria for LVH.No significant changes when compared with EKG on 12/04/18.  Reporting HR at home 60-70's/min. We discussed some side effects of Metoprolol, for now no changes. CXR order placed. Instructed about warning signs.  Lesion of skin of scalp We discussed possible causes. ? Psoriasis, SCC among some to be  considered. Continue Nystatin-Triamcinolone daily prn. Avoid manipulating/scratching lesion. Dermatology referral placed.  -     nystatin-triamcinolone (MYCOLOG II) cream; Apply 1 application topically 2 (two) times daily as needed for up to 14 days.  Hyperlipidemia, mixed Continue Atorvastatin 20 mg daily and low fat diet. Further recommendations according to FLP result.  The 10-year ASCVD risk score Mikey Bussing DC Brooke Bonito., et al., 2013) is: 8.6%   Values used to calculate the score:     Age: 59 years     Sex: Male     Is Non-Hispanic African American: No     Diabetic: No     Tobacco smoker: No     Systolic Blood Pressure: 147 mmHg     Is BP treated: Yes     HDL Cholesterol: 66.6 mg/dL     Total Cholesterol: 170 mg/dL  GAD (generalized anxiety disorder) Problem is stable. Continue Alprazolam 0.5 mg bid prn and Sertraline 50 mg daily.  -     ALPRAZolam (XANAX) 0.5 MG tablet; Take 1 tablet (0.5 mg total) by mouth 2 (two) times daily as needed for anxiety (no more than 50 per month.).  Hypertension, essential, benign BP adequately controlled. Continue Metoprolol succinate and Amlodipine-Olmesartan same dose. Low salt diet. Continue monitoring BP regularly.  Return in about 5 months (around 11/27/2020) for anxiety,HTN.   Isola Mehlman G. Martinique, MD  [facility]. [place] office.  A few things to remember from today's visit:   SOB (shortness of breath) - Plan: EKG 12-Lead, DG Chest 2 View  Lesion of skin of scalp - Plan: Ambulatory referral to Dermatology  Hyperlipidemia, mixed - Plan: Comprehensive metabolic panel, Lipid panel  GAD (generalized anxiety disorder)  Hypertension, essential, benign - Plan: Comprehensive metabolic panel  If you need refills please call your pharmacy. Do not use My Chart to request refills or for acute issues that need immediate attention.  Dermatology referral placed.  Please be sure medication list is accurate. If a new problem  present, please set up appointment sooner than planned today.

## 2020-06-27 NOTE — Patient Instructions (Signed)
A few things to remember from today's visit:   SOB (shortness of breath) - Plan: EKG 12-Lead, DG Chest 2 View  Lesion of skin of scalp - Plan: Ambulatory referral to Dermatology  Hyperlipidemia, mixed - Plan: Comprehensive metabolic panel, Lipid panel  GAD (generalized anxiety disorder)  Hypertension, essential, benign - Plan: Comprehensive metabolic panel  If you need refills please call your pharmacy. Do not use My Chart to request refills or for acute issues that need immediate attention.   Dermatology referral placed.  Please be sure medication list is accurate. If a new problem present, please set up appointment sooner than planned today.

## 2020-06-28 ENCOUNTER — Ambulatory Visit (INDEPENDENT_AMBULATORY_CARE_PROVIDER_SITE_OTHER)
Admission: RE | Admit: 2020-06-28 | Discharge: 2020-06-28 | Disposition: A | Discharge status: Home / Self Care | Payer: BLUE CROSS/BLUE SHIELD | Source: Ambulatory Visit | Attending: Family Medicine | Admitting: Family Medicine

## 2020-06-28 DIAGNOSIS — R0602 Shortness of breath: Secondary | ICD-10-CM

## 2020-06-28 LAB — COMPREHENSIVE METABOLIC PANEL
ALT: 25 U/L (ref 0–53)
AST: 24 U/L (ref 0–37)
Albumin: 4.7 g/dL (ref 3.5–5.2)
Alkaline Phosphatase: 74 U/L (ref 39–117)
BUN: 20 mg/dL (ref 6–23)
CO2: 27 mEq/L (ref 19–32)
Calcium: 9.6 mg/dL (ref 8.4–10.5)
Chloride: 103 mEq/L (ref 96–112)
Creatinine, Ser: 0.9 mg/dL (ref 0.40–1.50)
GFR: 90.59 mL/min (ref >60.00)
Glucose, Bld: 96 mg/dL (ref 70–99)
Potassium: 3.4 mEq/L — ABNORMAL LOW (ref 3.5–5.1)
Sodium: 140 mEq/L (ref 135–145)
Total Bilirubin: 0.7 mg/dL (ref 0.2–1.2)
Total Protein: 7.4 g/dL (ref 6.0–8.3)

## 2020-06-28 LAB — LIPID PANEL
Cholesterol: 170 mg/dL (ref 0–200)
HDL: 66.6 mg/dL (ref >39.00)
LDL Cholesterol: 78 mg/dL (ref 0–99)
NonHDL: 103.55
Total CHOL/HDL Ratio: 3
Triglycerides: 129 mg/dL (ref 0.0–149.0)
VLDL: 25.8 mg/dL (ref 0.0–40.0)

## 2020-06-29 MED ORDER — ATORVASTATIN CALCIUM 20 MG PO TABS: 20.0000 mg | ORAL_TABLET | Freq: Every day | ORAL | 3 refills | Status: DC

## 2020-07-04 DIAGNOSIS — L738 Other specified follicular disorders: Secondary | ICD-10-CM | POA: Diagnosis not present

## 2020-07-04 DIAGNOSIS — L981 Factitial dermatitis: Secondary | ICD-10-CM | POA: Diagnosis not present

## 2020-07-04 DIAGNOSIS — L0101 Non-bullous impetigo: Secondary | ICD-10-CM | POA: Diagnosis not present

## 2020-07-04 DIAGNOSIS — L700 Acne vulgaris: Secondary | ICD-10-CM | POA: Diagnosis not present

## 2020-07-04 DIAGNOSIS — L0100 Impetigo, unspecified: Secondary | ICD-10-CM | POA: Diagnosis not present

## 2020-07-25 ENCOUNTER — Other Ambulatory Visit: Payer: Self-pay | Admitting: Gastroenterology

## 2020-07-28 ENCOUNTER — Other Ambulatory Visit: Payer: Self-pay | Admitting: Family Medicine

## 2020-07-28 DIAGNOSIS — F411 Generalized anxiety disorder: Secondary | ICD-10-CM

## 2020-08-08 DIAGNOSIS — L738 Other specified follicular disorders: Secondary | ICD-10-CM | POA: Diagnosis not present

## 2020-08-08 DIAGNOSIS — L981 Factitial dermatitis: Secondary | ICD-10-CM | POA: Diagnosis not present

## 2020-09-13 DIAGNOSIS — L738 Other specified follicular disorders: Secondary | ICD-10-CM | POA: Diagnosis not present

## 2020-09-13 DIAGNOSIS — L981 Factitial dermatitis: Secondary | ICD-10-CM | POA: Diagnosis not present

## 2020-09-14 ENCOUNTER — Other Ambulatory Visit: Payer: Self-pay | Admitting: Gastroenterology

## 2020-09-14 ENCOUNTER — Other Ambulatory Visit: Payer: Self-pay | Admitting: Family Medicine

## 2020-09-15 ENCOUNTER — Other Ambulatory Visit: Payer: Self-pay | Admitting: Family Medicine

## 2020-10-19 ENCOUNTER — Other Ambulatory Visit: Payer: Self-pay | Admitting: Family Medicine

## 2020-11-03 ENCOUNTER — Other Ambulatory Visit: Payer: Self-pay | Admitting: Family Medicine

## 2020-11-03 DIAGNOSIS — F411 Generalized anxiety disorder: Secondary | ICD-10-CM

## 2020-12-26 NOTE — Progress Notes (Signed)
HPI: [name] is a 65 y.o.male here today for his routine physical examination.  Last CPE: 03/01/19 He lives with his wife  Regular exercise 3 or more times per week:He has not done so consistently for the past few months. Following a healthy diet: Yes, he does. Decreased red meet,eating fish 2 per week, chicken and pork.  Chronic medical problems: Anxiety, hyperlipidemia, hypertension.  Immunization History  Administered Date(s) Administered   Influenza,inj,Quad PF,6+ Mos 11/03/2017, 03/01/2019, 02/07/2020, 12/27/2020   Tdap 12/05/2017   Zoster Recombinat (Shingrix) 03/01/2019, 06/28/2019    Health Maintenance  Topic Date Due   HIV Screening  09/03/2025 (Originally 07/04/1970)   COLONOSCOPY (Pts 45-50yrs Insurance coverage will need to be confirmed)  02/11/2023   TETANUS/TDAP  12/06/2027   INFLUENZA VACCINE  Completed   Hepatitis C Screening  Completed   Zoster Vaccines- Shingrix  Completed   Pneumococcal Vaccine 8-64 Years old  Aged Out   HPV VACCINES  Aged Out   Last prostate ca screening: 04/26/19 1.25 Nocturia x 1-2, stable.  -Negative for high alcohol intake or tobacco use.  -Concerns and/or follow up today:   Anxiety: He is on Sertraline 50 mg daily and Alprazolam 0.5 mg bid. Negative for depression like symptoms.  Hypertension:  Medications:Azor 5-40 mg daily, metoprolol XL 100 mg daily. Side effects:none Last OV, 06/27/20, he was c/o SOB, it has resolved.  Lab Results  Component Value Date   CREATININE 0.90 06/27/2020   BUN 20 06/27/2020   NA 140 06/27/2020   K 3.4 (L) 06/27/2020   CL 103 06/27/2020   CO2 27 06/27/2020   Hyperlipidemia: Currently on Atorvastatin 20 mg daily. Side effects from medication:none Lab Results  Component Value Date   CHOL 170 06/27/2020   HDL 66.60 06/27/2020   LDLCALC 78 06/27/2020   LDLDIRECT 140.0 09/03/2017   TRIG 129.0 06/27/2020   CHOLHDL 3 06/27/2020   Review of Systems  Constitutional:   Negative for activity change, appetite change, fatigue and fever.  HENT:  Negative for mouth sores, nosebleeds, sore throat, trouble swallowing and voice change.   Eyes:  Negative for redness and visual disturbance.  Respiratory:  Negative for cough, shortness of breath and wheezing.   Cardiovascular:  Negative for chest pain, palpitations and leg swelling.  Gastrointestinal:  Negative for abdominal pain, blood in stool, nausea and vomiting.  Endocrine: Negative for cold intolerance, heat intolerance, polydipsia, polyphagia and polyuria.  Genitourinary:  Negative for decreased urine volume, dysuria, genital sores, hematuria and testicular pain.  Musculoskeletal:  Positive for arthralgias. Negative for gait problem and myalgias.  Skin:  Negative for color change and rash.  Allergic/Immunologic: Positive for environmental allergies.  Neurological:  Negative for syncope, weakness and headaches.  Hematological:  Negative for adenopathy. Does not bruise/bleed easily.  Psychiatric/Behavioral:  Negative for confusion. The patient is nervous/anxious.    Current Outpatient Medications on File Prior to Visit  Medication Sig Dispense Refill   ALPRAZolam (XANAX) 0.5 MG tablet TAKE 1 TABLET BY MOUTH TWICE A DAY AS NEEDED FOR ANXIETY NO MORE THAN 50 PER MONTH 50 tablet 3   amLODipine-olmesartan (AZOR) 5-40 MG tablet TAKE 1 TABLET BY MOUTH EVERY DAY 90 tablet 2   Ascorbic Acid (VITAMIN C ADULT GUMMIES PO) Take by mouth 2 (two) times daily.     atorvastatin (LIPITOR) 20 MG tablet Take 1 tablet (20 mg total) by mouth daily. 90 tablet 3   diclofenac sodium (VOLTAREN) 1 % GEL Apply 2 g topically 4 (four)  times daily. 350 g 6   metoprolol succinate (TOPROL-XL) 100 MG 24 hr tablet TAKE 1 TABLET BY MOUTH EVERY DAY WITH OR IMMEDIATELY FOLLOWING A MEAL 90 tablet 2   Multiple Vitamins-Minerals (HM MULTIVITAMIN ADULT GUMMY PO) Take by mouth 2 (two) times daily.     omeprazole (PRILOSEC) 40 MG capsule TAKE 1 CAPSULE BY  MOUTH EVERY DAY 90 capsule 0   sertraline (ZOLOFT) 50 MG tablet TAKE 1 TABLET BY MOUTH EVERY DAY 90 tablet 1   tadalafil (CIALIS) 20 MG tablet TAKE 1/2 TO 1 TABLET BY MOUTH DAILY AS NEEDED FOR ERECTILE DYSFUNCTION. 4 tablet 19   No current facility-administered medications on file prior to visit.   Past Medical History:  Diagnosis Date   Allergy    Anxiety    Arthritis    Depression    GERD (gastroesophageal reflux disease)    HLD (hyperlipidemia)    Hypertension    Sleep apnea    Sleep apnea with use of continuous positive airway pressure (CPAP)    uses CPAP nightly    Past Surgical History:  Procedure Laterality Date   BICEPS TENDON REPAIR Right 11/2015   upper   broken finger Right    Bone fusion from the broken finger   CHONDROPLASTY Right 03/10/2019   Procedure: CHONDROPLASTY;  Surgeon: Leandrew Koyanagi, MD;  Location: [place];  Service: Orthopedics;  Laterality: Right;   KNEE ARTHROSCOPY WITH MEDIAL MENISECTOMY Left 01/01/2019   Procedure: LEFT KNEE ARTHROSCOPY WITH PARTIAL MEDIAL AND LATERAL MENISCECTOMY;  Surgeon: Leandrew Koyanagi, MD;  Location: [place];  Service: Orthopedics;  Laterality: Left;   KNEE ARTHROSCOPY WITH MEDIAL MENISECTOMY Right 03/10/2019   Procedure: RIGHT KNEE ARTHROSCOPY WITH PARTIAL MEDIAL MENISCECTOMY;  Surgeon: Leandrew Koyanagi, MD;  Location: [place];  Service: Orthopedics;  Laterality: Right;   ROTATOR CUFF REPAIR Right 2013, 2017    Allergies  Allergen Reactions   Erythromycin Rash    Family History  Problem Relation Age of Onset   Arthritis Mother    Breast cancer Mother    Diabetes Father    Heart disease Father    Colon cancer Neg Hx    Esophageal cancer Neg Hx    Inflammatory bowel disease Neg Hx    Liver disease Neg Hx    Pancreatic cancer Neg Hx    Rectal cancer Neg Hx    Stomach cancer Neg Hx     Social History   Socioeconomic History   Marital status: Married    Spouse name:  Earley Abide   Number of children: 0   Years of education: Not on file   Highest education level: Not on file  Occupational History   Occupation: retired  Tobacco Use   Smoking status: Never   Smokeless tobacco: Never  Vaping Use   Vaping Use: Never used  Substance and Sexual Activity   Alcohol use: Yes    Comment: 2-3 a day/ 2-3 times per [facility]   Drug use: Never   Sexual activity: Yes  Other Topics Concern   Not on file  Social History Narrative   Not on file   Social Determinants of Health   Financial Resource Strain: Not on file  Food Insecurity: Not on file  Transportation Needs: Not on file  Physical Activity: Not on file  Stress: Not on file  Social Connections: Not on file   Vitals:   12/27/20 1353  BP: 120/70  Pulse: 62  Resp:  16  SpO2: 97%   Body mass index is 25.14 kg/m.  Wt Readings from Last 3 Encounters:  12/27/20 185 lb 6 oz (84.1 kg)  06/27/20 179 lb (81.2 kg)  09/03/19 179 lb 8 oz (81.4 kg)   Physical Exam Vitals and nursing note reviewed.  Constitutional:      General: He is not in acute distress.    Appearance: He is well-developed.  HENT:     Head: Normocephalic and atraumatic.     Right Ear: Tympanic membrane, ear canal and external ear normal.     Left Ear: Tympanic membrane, ear canal and external ear normal.     Mouth/Throat:     Mouth: Mucous membranes are moist.     Pharynx: Oropharynx is clear.  Eyes:     Extraocular Movements: Extraocular movements intact.     Conjunctiva/sclera: Conjunctivae normal.     Pupils: Pupils are equal, round, and reactive to light.  Neck:     Thyroid: No thyromegaly.     Trachea: No tracheal deviation.  Cardiovascular:     Rate and Rhythm: Normal rate and regular rhythm.     Pulses:          Dorsalis pedis pulses are 2+ on the right side and 2+ on the left side.     Heart sounds: No murmur heard. Pulmonary:     Effort: Pulmonary effort is normal. No respiratory distress.     Breath sounds:  Normal breath sounds.  Abdominal:     Palpations: Abdomen is soft. There is no hepatomegaly or mass.     Tenderness: There is no abdominal tenderness.  Genitourinary:    Comments: No concerns. Musculoskeletal:        General: No tenderness.     Cervical back: Normal range of motion.     Comments: No major deformities appreciated and no signs of synovitis.  Lymphadenopathy:     Cervical: No cervical adenopathy.     Upper Body:     Right upper body: No supraclavicular adenopathy.     Left upper body: No supraclavicular adenopathy.  Skin:    General: Skin is warm.     Findings: No erythema.  Neurological:     Mental Status: He is alert and oriented to person, place, and time.     Cranial Nerves: No cranial nerve deficit.     Sensory: No sensory deficit.     Coordination: Coordination normal.     Gait: Gait normal.     Deep Tendon Reflexes:     Reflex Scores:      Bicep reflexes are 2+ on the right side and 2+ on the left side.      Patellar reflexes are 2+ on the right side and 2+ on the left side. Psychiatric:        Mood and Affect: Affect normal.   ASSESSMENT AND PLAN:  [name] was seen today for annual exam.  Diagnoses and all orders for this visit: Orders Placed This Encounter  Procedures   Flu Vaccine QUAD 86mo+IM (Fluarix, Fluzone & Alfiuria Quad PF)   PSA(Must document that pt has been informed of limitations of PSA testing.)   Comprehensive metabolic panel   Hemoglobin A1c   Lipid panel   Lab Results  Component Value Date   CREATININE 0.88 12/27/2020   BUN 14 12/27/2020   NA 141 12/27/2020   K 4.0 12/27/2020   CL 102 12/27/2020   CO2 31 12/27/2020   Lab Results  Component Value  Date   ALT 21 12/27/2020   AST 22 12/27/2020   ALKPHOS 81 12/27/2020   BILITOT 0.8 12/27/2020   Lab Results  Component Value Date   CHOL 179 12/27/2020   HDL 67.70 12/27/2020   LDLCALC 85 12/27/2020   LDLDIRECT 140.0 09/03/2017   TRIG 133.0 12/27/2020   CHOLHDL 3  12/27/2020   Lab Results  Component Value Date   PSA 2.24 12/27/2020   Lab Results  Component Value Date   HGBA1C 5.6 12/27/2020   Routine general medical examination at a health care facility We discussed the importance of regular physical activity and healthy diet for prevention of chronic illness and/or complications. Preventive guidelines reviewed. Vaccination up to date.  Next CPE in a year. The 10-year ASCVD risk score (Arnett DK, et al., 2019) is: 9.5%   Values used to calculate the score:     Age: 7 years     Sex: Male     Is Non-Hispanic African American: No     Diabetic: No     Tobacco smoker: No     Systolic Blood Pressure: 326 mmHg     Is BP treated: Yes     HDL Cholesterol: 67.7 mg/dL     Total Cholesterol: 179 mg/dL  Prostate cancer screening -     PSA(Must document that pt has been informed of limitations of PSA testing.)  Screening for endocrine, metabolic and immunity disorder -     Hemoglobin A1c -     Comprehensive metabolic panel  Need for influenza vaccination -     Flu Vaccine QUAD 62mo+IM (Fluarix, Fluzone & Alfiuria Quad PF)  Hypertension, essential, benign Well controlled. No changes in Amlodipine-Olmesartan dose. Continue monitoring BP at home.  Hyperlipidemia, mixed Continue Atorvastatin 20 mg daily and low fat diet.  GAD (generalized anxiety disorder) Stable. Continue Alprazolam 0.5 mg bid prn and Sertraline 50 mg daily.  Return in 6 months (on 06/27/2021) for HTN and anxiety.  Tetsuo Coppola G. Martinique, MD  [facility]. [place] office.

## 2020-12-27 ENCOUNTER — Encounter: Payer: Self-pay | Admitting: Family Medicine

## 2020-12-27 ENCOUNTER — Ambulatory Visit (INDEPENDENT_AMBULATORY_CARE_PROVIDER_SITE_OTHER): Payer: BLUE CROSS/BLUE SHIELD | Admitting: Family Medicine

## 2020-12-27 VITALS — BP 120/70 | HR 62 | Resp 16 | Ht 72.0 in | Wt 185.4 lb

## 2020-12-27 DIAGNOSIS — Z130 Encounter for screening for diseases of the blood and blood-forming organs and certain disorders involving the immune mechanism: Secondary | ICD-10-CM

## 2020-12-27 DIAGNOSIS — I10 Essential (primary) hypertension: Secondary | ICD-10-CM

## 2020-12-27 DIAGNOSIS — Z0000 Encounter for general adult medical examination without abnormal findings: Secondary | ICD-10-CM

## 2020-12-27 DIAGNOSIS — Z23 Encounter for immunization: Secondary | ICD-10-CM

## 2020-12-27 DIAGNOSIS — Z125 Encounter for screening for malignant neoplasm of prostate: Secondary | ICD-10-CM | POA: Diagnosis not present

## 2020-12-27 DIAGNOSIS — Z1329 Encounter for screening for other suspected endocrine disorder: Secondary | ICD-10-CM

## 2020-12-27 DIAGNOSIS — E782 Mixed hyperlipidemia: Secondary | ICD-10-CM | POA: Diagnosis not present

## 2020-12-27 DIAGNOSIS — Z13228 Encounter for screening for other metabolic disorders: Secondary | ICD-10-CM | POA: Diagnosis not present

## 2020-12-27 DIAGNOSIS — F411 Generalized anxiety disorder: Secondary | ICD-10-CM

## 2020-12-27 LAB — COMPREHENSIVE METABOLIC PANEL
ALT: 21 U/L (ref 0–53)
AST: 22 U/L (ref 0–37)
Albumin: 4.5 g/dL (ref 3.5–5.2)
Alkaline Phosphatase: 81 U/L (ref 39–117)
BUN: 14 mg/dL (ref 6–23)
CO2: 31 mEq/L (ref 19–32)
Calcium: 9.5 mg/dL (ref 8.4–10.5)
Chloride: 102 mEq/L (ref 96–112)
Creatinine, Ser: 0.88 mg/dL (ref 0.40–1.50)
GFR: 90.89 mL/min (ref >60.00)
Glucose, Bld: 98 mg/dL (ref 70–99)
Potassium: 4 mEq/L (ref 3.5–5.1)
Sodium: 141 mEq/L (ref 135–145)
Total Bilirubin: 0.8 mg/dL (ref 0.2–1.2)
Total Protein: 7.1 g/dL (ref 6.0–8.3)

## 2020-12-27 LAB — LIPID PANEL
Cholesterol: 179 mg/dL (ref 0–200)
HDL: 67.7 mg/dL (ref >39.00)
LDL Cholesterol: 85 mg/dL (ref 0–99)
NonHDL: 111.48
Total CHOL/HDL Ratio: 3
Triglycerides: 133 mg/dL (ref 0.0–149.0)
VLDL: 26.6 mg/dL (ref 0.0–40.0)

## 2020-12-27 LAB — HEMOGLOBIN A1C: Hgb A1c MFr Bld: 5.6 % (ref 4.6–6.5)

## 2020-12-27 LAB — PSA: PSA: 2.24 ng/mL (ref 0.10–4.00)

## 2020-12-27 NOTE — Assessment & Plan Note (Addendum)
Stable. Continue Alprazolam 0.5 mg bid prn and Sertraline 50 mg daily.

## 2020-12-27 NOTE — Assessment & Plan Note (Signed)
Well controlled. No changes in Amlodipine-Olmesartan dose. Continue monitoring BP at home.

## 2020-12-27 NOTE — Assessment & Plan Note (Signed)
Continue Atorvastatin 20 mg daily and low fat diet. Further recommendations according to FLP result. 

## 2020-12-27 NOTE — Patient Instructions (Addendum)
A few things to remember from today's visit:  Routine general medical examination at a health care facility  Prostate cancer screening - Plan: PSA(Must document that pt has been informed of limitations of PSA testing.)  Hypertension, essential, benign  Hyperlipidemia, mixed - Plan: Lipid panel  Screening for endocrine, metabolic and immunity disorder - Plan: Comprehensive metabolic panel, Hemoglobin A1c  If you need refills please call your pharmacy. Do not use My Chart to request refills or for acute issues that need immediate attention.   Please be sure medication list is accurate. If a new problem present, please set up appointment sooner than planned today. Health Maintenance, Male Adopting a healthy lifestyle and getting preventive care are important in promoting health and wellness. Ask your health care provider about: The right schedule for you to have regular tests and exams. Things you can do on your own to prevent diseases and keep yourself healthy. What should I know about diet, weight, and exercise? Eat a healthy diet  Eat a diet that includes plenty of vegetables, fruits, low-fat dairy products, and lean protein. Do not eat a lot of foods that are high in solid fats, added sugars, or sodium. Maintain a healthy weight Body mass index (BMI) is a measurement that can be used to identify possible weight problems. It estimates body fat based on height and weight. Your health care provider can help determine your BMI and help you achieve or maintain a healthy weight. Get regular exercise Get regular exercise. This is one of the most important things you can do for your health. Most adults should: Exercise for at least 150 minutes each week. The exercise should increase your heart rate and make you sweat (moderate-intensity exercise). Do strengthening exercises at least twice a week. This is in addition to the moderate-intensity exercise. Spend less time sitting. Even light  physical activity can be beneficial. Watch cholesterol and blood lipids Have your blood tested for lipids and cholesterol at 64 years of age, then have this test every 5 years. You may need to have your cholesterol levels checked more often if: Your lipid or cholesterol levels are high. You are older than 64 years of age. You are at high risk for heart disease. What should I know about cancer screening? Many types of cancers can be detected early and may often be prevented. Depending on your health history and family history, you may need to have cancer screening at various ages. This may include screening for: Colorectal cancer. Prostate cancer. Skin cancer. Lung cancer. What should I know about heart disease, diabetes, and high blood pressure? Blood pressure and heart disease High blood pressure causes heart disease and increases the risk of stroke. This is more likely to develop in people who have high blood pressure readings or are overweight. Talk with your health care provider about your target blood pressure readings. Have your blood pressure checked: Every 3-5 years if you are 44-5 years of age. Every year if you are 53 years old or older. If you are between the ages of 55 and 57 and are a current or former smoker, ask your health care provider if you should have a one-time screening for abdominal aortic aneurysm (AAA). Diabetes Have regular diabetes screenings. This checks your fasting blood sugar level. Have the screening done: Once every three years after age 73 if you are at a normal weight and have a low risk for diabetes. More often and at a younger age if you are overweight or  have a high risk for diabetes. What should I know about preventing infection? Hepatitis B If you have a higher risk for hepatitis B, you should be screened for this virus. Talk with your health care provider to find out if you are at risk for hepatitis B infection. Hepatitis C Blood testing is  recommended for: Everyone born from [birth year] through 1964. Anyone with known risk factors for hepatitis C. Sexually transmitted infections (STIs) You should be screened each year for STIs, including gonorrhea and chlamydia, if: You are sexually active and are younger than 64 years of age. You are older than 64 years of age and your health care provider tells you that you are at risk for this type of infection. Your sexual activity has changed since you were last screened, and you are at increased risk for chlamydia or gonorrhea. Ask your health care provider if you are at risk. Ask your health care provider about whether you are at high risk for HIV. Your health care provider may recommend a prescription medicine to help prevent HIV infection. If you choose to take medicine to prevent HIV, you should first get tested for HIV. You should then be tested every 3 months for as long as you are taking the medicine. Follow these instructions at home: Alcohol use Do not drink alcohol if your health care provider tells you not to drink. If you drink alcohol: Limit how much you have to 0-2 drinks a day. Know how much alcohol is in your drink. In the [place] one drink equals one 12 oz bottle of beer (355 mL), one 5 oz glass of wine (148 mL), or one 1 oz glass of hard liquor (44 mL). Lifestyle Do not use any products that contain nicotine or tobacco. These products include cigarettes, chewing tobacco, and vaping devices, such as e-cigarettes. If you need help quitting, ask your health care provider. Do not use street drugs. Do not share needles. Ask your health care provider for help if you need support or information about quitting drugs. General instructions Schedule regular health, dental, and eye exams. Stay current with your vaccines. Tell your health care provider if: You often feel depressed. You have ever been abused or do not feel safe at home. Summary Adopting a healthy lifestyle and getting  preventive care are important in promoting health and wellness. Follow your health care provider's instructions about healthy diet, exercising, and getting tested or screened for diseases. Follow your health care provider's instructions on monitoring your cholesterol and blood pressure. This information is not intended to replace advice given to you by your health care provider. Make sure you discuss any questions you have with your health care provider. Document Revised: 05/22/2020 Document Reviewed: 05/22/2020 Elsevier Patient Education  [place].

## 2021-01-09 DIAGNOSIS — H2513 Age-related nuclear cataract, bilateral: Secondary | ICD-10-CM | POA: Diagnosis not present

## 2021-02-28 ENCOUNTER — Other Ambulatory Visit: Payer: Self-pay | Admitting: Family Medicine

## 2021-02-28 DIAGNOSIS — F411 Generalized anxiety disorder: Secondary | ICD-10-CM

## 2021-03-08 ENCOUNTER — Other Ambulatory Visit: Payer: Self-pay | Admitting: Gastroenterology

## 2021-03-27 ENCOUNTER — Other Ambulatory Visit: Payer: Self-pay | Admitting: Gastroenterology

## 2021-04-27 ENCOUNTER — Other Ambulatory Visit: Payer: Self-pay | Admitting: Family Medicine

## 2021-04-30 ENCOUNTER — Other Ambulatory Visit: Payer: Self-pay

## 2021-04-30 DIAGNOSIS — F411 Generalized anxiety disorder: Secondary | ICD-10-CM

## 2021-04-30 MED ORDER — SERTRALINE HCL 50 MG PO TABS: 50.0000 mg | ORAL_TABLET | Freq: Every day | ORAL | 2 refills | Status: DC

## 2021-05-16 ENCOUNTER — Telehealth: Payer: Self-pay

## 2021-05-16 NOTE — Telephone Encounter (Signed)
---  Caller states needs a script [facility] to [facility]. Has been ?out of medication for a week. Had extra medication of ?zoloft so he had been declining the refill for 6 months ?but now he is out. Advised to call office tomorrow ?during normal office hours. ? ?05/14/2021 7:34:18 PM Clinical Call Marlowe Sax, RN, [facility] ? ?05/16/21 1105- Medication was refilled on 04/30/21. Pt notified of this. Pt states there is an issue with his insurance company, but that it is being handled by the pharmacist. Pt denies additional concerns for Dr Martinique. ?

## 2021-05-18 ENCOUNTER — Telehealth: Payer: Self-pay | Admitting: Orthopaedic Surgery

## 2021-05-18 NOTE — Telephone Encounter (Signed)
Patient called. He would like gel injections in his knee. His call back number is [phone] ?

## 2021-05-23 NOTE — Telephone Encounter (Signed)
Noted  

## 2021-05-24 ENCOUNTER — Other Ambulatory Visit: Payer: Self-pay | Admitting: Gastroenterology

## 2021-05-24 ENCOUNTER — Other Ambulatory Visit: Payer: Self-pay | Admitting: Family Medicine

## 2021-05-25 ENCOUNTER — Telehealth: Payer: Self-pay

## 2021-05-25 NOTE — Telephone Encounter (Signed)
VOB submitted for SynviscOne, bilateral knee BV pending 

## 2021-05-29 ENCOUNTER — Ambulatory Visit: Payer: BLUE CROSS/BLUE SHIELD | Admitting: Orthopaedic Surgery

## 2021-05-31 ENCOUNTER — Other Ambulatory Visit: Payer: Self-pay

## 2021-05-31 ENCOUNTER — Other Ambulatory Visit: Payer: Self-pay | Admitting: Family Medicine

## 2021-05-31 DIAGNOSIS — G8929 Other chronic pain: Secondary | ICD-10-CM

## 2021-05-31 NOTE — Telephone Encounter (Signed)
Last OV: 12/27/20  Hypertension, essential, benign Well controlled. No changes in Amlodipine-Olmesartan dose. Continue monitoring BP at home.    Will confirm with PCP if pt is supposed to be taking Metoprolol as well.

## 2021-06-07 ENCOUNTER — Encounter: Payer: Self-pay | Admitting: Orthopaedic Surgery

## 2021-06-07 ENCOUNTER — Ambulatory Visit: Payer: BLUE CROSS/BLUE SHIELD | Admitting: Orthopaedic Surgery

## 2021-06-07 DIAGNOSIS — M1711 Unilateral primary osteoarthritis, right knee: Secondary | ICD-10-CM

## 2021-06-07 DIAGNOSIS — M1712 Unilateral primary osteoarthritis, left knee: Secondary | ICD-10-CM

## 2021-06-07 DIAGNOSIS — M170 Bilateral primary osteoarthritis of knee: Secondary | ICD-10-CM

## 2021-06-07 NOTE — Progress Notes (Signed)
Office Visit Note   Patient: Harold Brown           Date of Birth: [birth year]           MRN: [id number] Visit Date: 06/07/2021              Requested by: Martinique, Betty G, MD [address] [place],  [place] [address] PCP: Martinique, Betty G, MD   Assessment & Plan: Visit Diagnoses:  1. Bilateral primary osteoarthritis of knee     Plan: Impression is bilateral knee osteoarthritis.  Today, proceed with bilateral knee Synvisc 1 injections.  He tolerated these well.  He will follow-up with [place] as needed.  Follow-Up Instructions: Return if symptoms worsen or fail to improve.   Orders:  Orders Placed This Encounter  Procedures   Large Joint Inj   No orders of the defined types were placed in this encounter.     Procedures: Large Joint Inj: bilateral knee on 06/07/2021 10:59 AM Indications: pain Details: 22 G needle, anterolateral approach Medications (Right): 0.66 mL bupivacaine 0.25 %; 3 mL lidocaine 1 %; 48 mg Hylan 48 MG/6ML Medications (Left): 0.66 mL bupivacaine 0.25 %; 3 mL lidocaine 1 %; 48 mg Hylan 48 MG/6ML     Clinical Data: No additional findings.   Subjective: Chief Complaint  Patient presents with   Left Knee - Pain   Right Knee - Pain    HPI patient is a pleasant 66 year old gentleman who comes in today for bilateral knee Synvisc 1 injections.  He has underlying osteoarthritis.  He has had cortisone injections in the past with good relief.     Objective: Vital Signs: There were no vitals taken for this visit.    Ortho Exam stable bilateral knee exam  Specialty Comments:  No specialty comments available.  Imaging: No new imaging   PMFS History: Patient Active Problem List   Diagnosis Date Noted   Erectile dysfunction 09/03/2019   Synovitis of knee 03/10/2019   Idiopathic esophageal varices without bleeding ([place]) 03/01/2019   Gastroesophageal reflux disease with esophagitis without hemorrhage 12/04/2018   Acute medial meniscus  tear of right knee 11/27/2018   Acute medial meniscus tear of left knee 11/27/2018   Hyperlipidemia, mixed 28/41/3244   Periumbilical pain 01/16/7251   History of colonic polyps 01/27/2018   Cystic acne vulgaris 11/03/2017   Insomnia 11/03/2017   GAD (generalized anxiety disorder) 11/03/2017   Hypertension, essential, benign 11/03/2017   Bilateral primary osteoarthritis of knee 09/11/2017   Past Medical History:  Diagnosis Date   Allergy    Anxiety    Arthritis    Depression    GERD (gastroesophageal reflux disease)    HLD (hyperlipidemia)    Hypertension    Sleep apnea    Sleep apnea with use of continuous positive airway pressure (CPAP)    uses CPAP nightly    Family History  Problem Relation Age of Onset   Arthritis Mother    Breast cancer Mother    Diabetes Father    Heart disease Father    Colon cancer Neg Hx    Esophageal cancer Neg Hx    Inflammatory bowel disease Neg Hx    Liver disease Neg Hx    Pancreatic cancer Neg Hx    Rectal cancer Neg Hx    Stomach cancer Neg Hx     Past Surgical History:  Procedure Laterality Date   BICEPS TENDON REPAIR Right 11/2015   upper   broken finger Right  Bone fusion from the broken finger   CHONDROPLASTY Right 03/10/2019   Procedure: CHONDROPLASTY;  Surgeon: Leandrew Koyanagi, MD;  Location: [place];  Service: Orthopedics;  Laterality: Right;   KNEE ARTHROSCOPY WITH MEDIAL MENISECTOMY Left 01/01/2019   Procedure: LEFT KNEE ARTHROSCOPY WITH PARTIAL MEDIAL AND LATERAL MENISCECTOMY;  Surgeon: Leandrew Koyanagi, MD;  Location: [place];  Service: Orthopedics;  Laterality: Left;   KNEE ARTHROSCOPY WITH MEDIAL MENISECTOMY Right 03/10/2019   Procedure: RIGHT KNEE ARTHROSCOPY WITH PARTIAL MEDIAL MENISCECTOMY;  Surgeon: Leandrew Koyanagi, MD;  Location: [place];  Service: Orthopedics;  Laterality: Right;   ROTATOR CUFF REPAIR Right 2013, 2017   Social History   Occupational History    Occupation: retired  Tobacco Use   Smoking status: Never   Smokeless tobacco: Never  Vaping Use   Vaping Use: Never used  Substance and Sexual Activity   Alcohol use: Yes    Comment: 2-3 a day/ 2-3 times per [facility]   Drug use: Never   Sexual activity: Yes

## 2021-06-11 MED ORDER — LIDOCAINE HCL 1 % IJ SOLN
3.0000 mL | INTRAMUSCULAR | Status: AC | PRN
  Administered 2021-06-07: 3 mL

## 2021-06-11 MED ORDER — HYLAN G-F 20 48 MG/6ML IX SOSY
48.0000 mg | PREFILLED_SYRINGE | INTRA_ARTICULAR | Status: AC | PRN
  Administered 2021-06-07: 48 mg via INTRA_ARTICULAR

## 2021-06-11 MED ORDER — BUPIVACAINE HCL 0.25 % IJ SOLN
0.6600 mL | INTRAMUSCULAR | Status: AC | PRN
  Administered 2021-06-07: .66 mL via INTRA_ARTICULAR

## 2021-06-25 ENCOUNTER — Other Ambulatory Visit: Payer: Self-pay | Admitting: Family Medicine

## 2021-06-25 DIAGNOSIS — F411 Generalized anxiety disorder: Secondary | ICD-10-CM

## 2021-06-25 NOTE — Telephone Encounter (Signed)
-   last filled 05/29/21 - last office visit 12/27/20 - next office visit 06/27/21

## 2021-06-26 NOTE — Progress Notes (Signed)
HPI: Harold Brown is a 66 y.o. male, who is here today t for follow-up. He was last seen on 12/27/2020. Since his last visit he has followed with orthopedist.  Hypertension currently on amlodipine-olmesartan 5-40 mg daily and metoprolol succinate 100 mg daily. Negative for severe/frequent headache, visual changes, chest pain, dyspnea, palpitation, claudication, focal weakness, or edema. Home BP's 120's/70-80.  Trying to follow a healthful diet. Walking a few times per week. He cannot run because knee pain.  Lab Results  Component Value Date   CREATININE 0.88 12/27/2020   BUN 14 12/27/2020   NA 141 12/27/2020   K 4.0 12/27/2020   CL 102 12/27/2020   CO2 31 12/27/2020   Anxiety on sertraline 50 mg daily and alprazolam 0.5 mg twice daily as needed. C/O worsening insomnia, difficulty falling asleep. Taking Alprazolam at bedtime helps most of the time. He has tried Melatonin but it did not help. Has had insomnia for 20 year.  For the past 2-3 months he has taken Alprazolam bid daily, so running out of medication earlier.  Requesting refills on Nystatin-Triamcinolone cream, which he uses daily as needed on scalp and has helped with pruritus and scalp lesions.  Review of Systems  Constitutional:  Negative for activity change, appetite change and fever.  HENT:  Negative for nosebleeds, sore throat and trouble swallowing.   Respiratory:  Negative for cough and wheezing.   Gastrointestinal:  Negative for abdominal pain, nausea and vomiting.  Genitourinary:  Negative for decreased urine volume, dysuria and hematuria.  Musculoskeletal:  Positive for arthralgias.  Neurological:  Negative for syncope, facial asymmetry and weakness.  Psychiatric/Behavioral:  Positive for sleep disturbance. Negative for confusion and hallucinations. The patient is nervous/anxious.   Rest see pertinent positives and negatives per HPI.  Current Outpatient Medications on File Prior to Visit   Medication Sig Dispense Refill   amLODipine-olmesartan (AZOR) 5-40 MG tablet TAKE 1 TABLET BY MOUTH EVERY DAY 90 tablet 2   Ascorbic Acid (VITAMIN C ADULT GUMMIES PO) Take by mouth 2 (two) times daily.     atorvastatin (LIPITOR) 20 MG tablet TAKE 1 TABLET BY MOUTH EVERY DAY 90 tablet 3   diclofenac sodium (VOLTAREN) 1 % GEL Apply 2 g topically 4 (four) times daily. 350 g 6   metoprolol succinate (TOPROL-XL) 100 MG 24 hr tablet TAKE 1 TABLET BY MOUTH EVERY DAY WITH OR IMMEDIATELY FOLLOWING A MEAL 90 tablet 0   Multiple Vitamins-Minerals (HM MULTIVITAMIN ADULT GUMMY PO) Take by mouth 2 (two) times daily.     omeprazole (PRILOSEC) 40 MG capsule TAKE 1 CAPSULE BY MOUTH EVERY DAY 90 capsule 0   sertraline (ZOLOFT) 50 MG tablet Take 1 tablet (50 mg total) by mouth daily. 90 tablet 2   tadalafil (CIALIS) 20 MG tablet TAKE 1/2 TO 1 TABLET BY MOUTH DAILY AS NEEDED FOR ERECTILE DYSFUNCTION. 4 tablet 19   No current facility-administered medications on file prior to visit.   Past Medical History:  Diagnosis Date   Allergy    Anxiety    Arthritis    Depression    GERD (gastroesophageal reflux disease)    HLD (hyperlipidemia)    Hypertension    Sleep apnea    Sleep apnea with use of continuous positive airway pressure (CPAP)    uses CPAP nightly   Allergies  Allergen Reactions   Erythromycin Rash   Social History   Socioeconomic History   Marital status: Married    Spouse name: Earley Abide   Number  of children: 0   Years of education: Not on file   Highest education level: Not on file  Occupational History   Occupation: retired  Tobacco Use   Smoking status: Never   Smokeless tobacco: Never  Vaping Use   Vaping Use: Never used  Substance and Sexual Activity   Alcohol use: Yes    Comment: 2-3 a day/ 2-3 times per [facility]   Drug use: Never   Sexual activity: Yes  Other Topics Concern   Not on file  Social History Narrative   Not on file   Social Determinants of Health    Financial Resource Strain: Not on file  Food Insecurity: Not on file  Transportation Needs: Not on file  Physical Activity: Not on file  Stress: Not on file  Social Connections: Not on file   Vitals:   06/27/21 1430  BP: 128/70  Pulse: 66  Resp: 16  SpO2: 93%   Body mass index is 24.34 kg/m.  Physical Exam Vitals and nursing note reviewed.  Constitutional:      General: He is not in acute distress.    Appearance: He is well-developed.  HENT:     Head: Normocephalic and atraumatic.  Eyes:     Conjunctiva/sclera: Conjunctivae normal.  Cardiovascular:     Rate and Rhythm: Normal rate and regular rhythm.     Pulses:          Dorsalis pedis pulses are 2+ on the right side and 2+ on the left side.     Heart sounds: No murmur heard. Pulmonary:     Effort: Pulmonary effort is normal. No respiratory distress.     Breath sounds: Normal breath sounds.  Abdominal:     Palpations: Abdomen is soft. There is no hepatomegaly or mass.     Tenderness: There is no abdominal tenderness.  Lymphadenopathy:     Cervical: No cervical adenopathy.  Skin:    General: Skin is warm.     Findings: No erythema or rash.  Neurological:     Mental Status: He is alert and oriented to person, place, and time.     Cranial Nerves: No cranial nerve deficit.     Gait: Gait normal.  Psychiatric:     Comments: Well groomed, good eye contact.   ASSESSMENT AND PLAN:  Mr.Kindred was seen today for follow-up.  Diagnoses and all orders for this visit: Orders Placed This Encounter  Procedures   Basic metabolic panel   Lab Results  Component Value Date   CREATININE 0.98 06/27/2021   BUN 14 06/27/2021   NA 141 06/27/2021   K 3.6 06/27/2021   CL 105 06/27/2021   CO2 25 06/27/2021   Insomnia, unspecified type Problem has been getting worse, Alprazolam helps most of the time, so continue 0.5 mg at bedtime as needed. Good sleep hygiene.  -     ALPRAZolam (XANAX) 0.5 MG tablet; Take 1 tablet  (0.5 mg total) by mouth 2 (two) times daily as needed for anxiety.  Hypertension, essential, benign BP adequately controlled. Continue current management: Metoprolol succinate 100 mg daily and Amlodipine-Olmesartan 5-40 mg daily. DASH/low salt diet to continue. Continue monitoring BP at home. Eye exam is current.  Seborrheic dermatitis of scalp Problem is well controlled with topical treatment, no changes in Nystatin-triamcinolone.  -     nystatin-triamcinolone (MYCOLOG II) cream; Apply 1 application  topically 2 (two) times daily as needed.  GAD (generalized anxiety disorder) Stable. Continue Alprazolam 0.5 mg bid prn, taking it  daily, so Rx changed from 50 to 60 tabs. Continue sertraline 50 mg daily.  -     ALPRAZolam (XANAX) 0.5 MG tablet; Take 1 tablet (0.5 mg total) by mouth 2 (two) times daily as needed for anxiety.  Return in about 27 weeks (around 01/02/2022) for cpe.  Haeley Fordham G. Martinique, MD  [facility]. [place] office.

## 2021-06-27 ENCOUNTER — Ambulatory Visit (INDEPENDENT_AMBULATORY_CARE_PROVIDER_SITE_OTHER): Payer: Medicare Other | Admitting: Family Medicine

## 2021-06-27 ENCOUNTER — Encounter: Payer: Self-pay | Admitting: Family Medicine

## 2021-06-27 VITALS — BP 128/70 | HR 66 | Resp 16 | Ht 72.0 in | Wt 179.5 lb

## 2021-06-27 DIAGNOSIS — I10 Essential (primary) hypertension: Secondary | ICD-10-CM | POA: Diagnosis not present

## 2021-06-27 DIAGNOSIS — F411 Generalized anxiety disorder: Secondary | ICD-10-CM

## 2021-06-27 DIAGNOSIS — L219 Seborrheic dermatitis, unspecified: Secondary | ICD-10-CM

## 2021-06-27 DIAGNOSIS — G4700 Insomnia, unspecified: Secondary | ICD-10-CM | POA: Diagnosis not present

## 2021-06-27 MED ORDER — NYSTATIN-TRIAMCINOLONE 100000-0.1 UNIT/GM-% EX CREA: 1.0000 "application " | TOPICAL_CREAM | Freq: Two times a day (BID) | CUTANEOUS | 3 refills | Status: DC | PRN

## 2021-06-27 MED ORDER — ALPRAZOLAM 0.5 MG PO TABS: 0.5000 mg | ORAL_TABLET | Freq: Two times a day (BID) | ORAL | 3 refills | Status: DC | PRN

## 2021-06-27 NOTE — Patient Instructions (Addendum)
A few things to remember from today's visit:   GAD (generalized anxiety disorder) - Plan: ALPRAZolam (XANAX) 0.5 MG tablet  Hypertension, essential, benign - Plan: Basic metabolic panel  Insomnia, unspecified type  If you need refills please call your pharmacy. Do not use My Chart to request refills or for acute issues that need immediate attention.   I increased number of Xanax per months. Good sleep hygiene.  Please be sure medication list is accurate. If a new problem present, please set up appointment sooner than planned today. Insomnia Insomnia is a sleep disorder that makes it difficult to fall asleep or stay asleep. Insomnia can cause fatigue, low energy, difficulty concentrating, mood swings, and poor performance at work or school. There are three different ways to classify insomnia: Difficulty falling asleep. Difficulty staying asleep. Waking up too early in the morning. Any type of insomnia can be long-term (chronic) or short-term (acute). Both are common. Short-term insomnia usually lasts for 3 months or less. Chronic insomnia occurs at least three times a week for longer than 3 months. What are the causes? Insomnia may be caused by another condition, situation, or substance, such as: Having certain mental health conditions, such as anxiety and depression. Using caffeine, alcohol, tobacco, or drugs. Having gastrointestinal conditions, such as gastroesophageal reflux disease (GERD). Having certain medical conditions. These include: Asthma. Alzheimer's disease. Stroke. Chronic pain. An overactive thyroid gland (hyperthyroidism). Other sleep disorders, such as restless legs syndrome and sleep apnea. Menopause. Sometimes, the cause of insomnia may not be known. What increases the risk? Risk factors for insomnia include: Gender. Females are affected more often than males. Age. Insomnia is more common as people get older. Stress and certain medical and mental health  conditions. Lack of exercise. Having an irregular work schedule. This may include working night shifts and traveling between different time zones. What are the signs or symptoms? If you have insomnia, the main symptom is having trouble falling asleep or having trouble staying asleep. This may lead to other symptoms, such as: Feeling tired or having low energy. Feeling nervous about going to sleep. Not feeling rested in the morning. Having trouble concentrating. Feeling irritable, anxious, or depressed. How is this diagnosed? This condition may be diagnosed based on: Your symptoms and medical history. Your health care provider may ask about: Your sleep habits. Any medical conditions you have. Your mental health. A physical exam. How is this treated? Treatment for insomnia depends on the cause. Treatment may focus on treating an underlying condition that is causing the insomnia. Treatment may also include: Medicines to help you sleep. Counseling or therapy. Lifestyle adjustments to help you sleep better. Follow these instructions at home: Eating and drinking  Limit or avoid alcohol, caffeinated beverages, and products that contain nicotine and tobacco, especially close to bedtime. These can disrupt your sleep. Do not eat a large meal or eat spicy foods right before bedtime. This can lead to digestive discomfort that can make it hard for you to sleep. Sleep habits  Keep a sleep diary to help you and your health care provider figure out what could be causing your insomnia. Write down: When you sleep. When you wake up during the night. How well you sleep and how rested you feel the next day. Any side effects of medicines you are taking. What you eat and drink. Make your bedroom a dark, comfortable place where it is easy to fall asleep. Put up shades or blackout curtains to block light from outside. Use a  white noise machine to block noise. Keep the temperature cool. Limit screen use  before bedtime. This includes: Not watching TV. Not using your smartphone, tablet, or computer. Stick to a routine that includes going to bed and waking up at the same times every day and night. This can help you fall asleep faster. Consider making a quiet activity, such as reading, part of your nighttime routine. Try to avoid taking naps during the day so that you sleep better at night. Get out of bed if you are still awake after 15 minutes of trying to sleep. Keep the lights down, but try reading or doing a quiet activity. When you feel sleepy, go back to bed. General instructions Take over-the-counter and prescription medicines only as told by your health care provider. Exercise regularly as told by your health care provider. However, avoid exercising in the hours right before bedtime. Use relaxation techniques to manage stress. Ask your health care provider to suggest some techniques that may work well for you. These may include: Breathing exercises. Routines to release muscle tension. Visualizing peaceful scenes. Make sure that you drive carefully. Do not drive if you feel very sleepy. Keep all follow-up visits. This is important. Contact a health care provider if: You are tired throughout the day. You have trouble in your daily routine due to sleepiness. You continue to have sleep problems, or your sleep problems get worse. Get help right away if: You have thoughts about hurting yourself or someone else. Get help right away if you feel like you may hurt yourself or others, or have thoughts about taking your own life. Go to your nearest emergency room or: Call 911. Call the [place] at [phone] or 988. This is open 24 hours a day. Text the Crisis Text Line at (863)116-0525. Summary Insomnia is a sleep disorder that makes it difficult to fall asleep or stay asleep. Insomnia can be long-term (chronic) or short-term (acute). Treatment for insomnia depends on  the cause. Treatment may focus on treating an underlying condition that is causing the insomnia. Keep a sleep diary to help you and your health care provider figure out what could be causing your insomnia. This information is not intended to replace advice given to you by your health care provider. Make sure you discuss any questions you have with your health care provider. Document Revised: 12/11/2020 Document Reviewed: 12/11/2020 Elsevier Patient Education  [place].

## 2021-06-28 LAB — BASIC METABOLIC PANEL
BUN: 14 mg/dL (ref 6–23)
CO2: 25 mEq/L (ref 19–32)
Calcium: 9.7 mg/dL (ref 8.4–10.5)
Chloride: 105 mEq/L (ref 96–112)
Creatinine, Ser: 0.98 mg/dL (ref 0.40–1.50)
GFR: 81.22 mL/min (ref >60.00)
Glucose, Bld: 101 mg/dL — ABNORMAL HIGH (ref 70–99)
Potassium: 3.6 mEq/L (ref 3.5–5.1)
Sodium: 141 mEq/L (ref 135–145)

## 2021-06-30 ENCOUNTER — Encounter: Payer: Self-pay | Admitting: Family Medicine

## 2021-07-25 ENCOUNTER — Other Ambulatory Visit: Payer: Self-pay | Admitting: Gastroenterology

## 2021-08-24 ENCOUNTER — Other Ambulatory Visit: Payer: Self-pay | Admitting: Gastroenterology

## 2021-08-26 ENCOUNTER — Other Ambulatory Visit: Payer: Self-pay | Admitting: Gastroenterology

## 2021-10-01 ENCOUNTER — Other Ambulatory Visit: Payer: Self-pay | Admitting: Family Medicine

## 2021-10-01 DIAGNOSIS — L219 Seborrheic dermatitis, unspecified: Secondary | ICD-10-CM

## 2021-11-03 ENCOUNTER — Other Ambulatory Visit: Payer: Self-pay | Admitting: Family Medicine

## 2021-11-03 ENCOUNTER — Other Ambulatory Visit: Payer: Self-pay | Admitting: Gastroenterology

## 2021-11-03 DIAGNOSIS — G4700 Insomnia, unspecified: Secondary | ICD-10-CM

## 2021-11-03 DIAGNOSIS — F411 Generalized anxiety disorder: Secondary | ICD-10-CM

## 2021-11-05 NOTE — Telephone Encounter (Signed)
-   last OV 06/27/21 - last filled 10/01/21 - next OV 12/28/21

## 2021-12-07 ENCOUNTER — Other Ambulatory Visit: Payer: Self-pay | Admitting: Gastroenterology

## 2021-12-07 ENCOUNTER — Other Ambulatory Visit: Payer: Self-pay | Admitting: Family Medicine

## 2021-12-07 DIAGNOSIS — F411 Generalized anxiety disorder: Secondary | ICD-10-CM

## 2021-12-26 NOTE — Progress Notes (Unsigned)
HPI: [name] is a 66 y.o. male, who is here today for chronic disease management.  Last seen on 06/27/2021, at that time he was complaining of worsening insomnia, he was recommended to take alprazolam 0.5 mg at bedtime as needed. *** He reports that his sleep has improved, and he is now able to fall back asleep if he wakes up during the night. The patient has been using a CPAP machine for 20 years and is accustomed to it. he has been checking his blood pressure at home regularly and it has been within the normal range. He mentions that he has not been walking as much due to knee issues and lack of interest from his walking partner, but he remains active through house projects, riding a motorcycle, and cutting the lawn. Negative for severe/frequent headache, visual changes, chest pain, dyspnea, palpitation, claudication, focal weakness, or edema. *** Lab Results  Component Value Date   CREATININE 0.98 06/27/2021   BUN 14 06/27/2021   NA 141 06/27/2021   K 3.6 06/27/2021   CL 105 06/27/2021   CO2 25 06/27/2021   HLD:  Lab Results  Component Value Date   CHOL 179 12/27/2020   HDL 67.70 12/27/2020   LDLCALC 85 12/27/2020   LDLDIRECT 140.0 09/03/2017   TRIG 133.0 12/27/2020   CHOLHDL 3 12/27/2020  The patient maintains a healthy diet and has been avoiding certain foods and drinks to help manage his acid reflux. He has been taking Omeprazole, which has been effective in controlling his symptoms. The patient also reports experiencing discomfort if he does not eat around his usual mealtime, which he attributes to blood sugar.  Bilateral upper pretibial prominence. *** *** Review of Systems See other pertinent positives and negatives in HPI.  Current Outpatient Medications on File Prior to Visit  Medication Sig Dispense Refill   ALPRAZolam (XANAX) 0.5 MG tablet TAKE 1 TABLET BY MOUTH 2 TIMES DAILY AS NEEDED FOR ANXIETY. 60 tablet 3   amLODipine-olmesartan (AZOR) 5-40 MG  tablet TAKE 1 TABLET BY MOUTH EVERY DAY 90 tablet 2   Ascorbic Acid (VITAMIN C ADULT GUMMIES PO) Take by mouth 2 (two) times daily.     atorvastatin (LIPITOR) 20 MG tablet TAKE 1 TABLET BY MOUTH EVERY DAY 90 tablet 3   diclofenac sodium (VOLTAREN) 1 % GEL Apply 2 g topically 4 (four) times daily. 350 g 6   metoprolol succinate (TOPROL-XL) 100 MG 24 hr tablet TAKE 1 TABLET BY MOUTH EVERY DAY WITH OR IMMEDIATELY FOLLOWING A MEAL 90 tablet 0   Multiple Vitamins-Minerals (HM MULTIVITAMIN ADULT GUMMY PO) Take by mouth 2 (two) times daily.     nystatin-triamcinolone (MYCOLOG II) cream APPLY 1 APPLICATION TOPICALLY TWICE A DAY AS NEEDED 30 g 3   omeprazole (PRILOSEC) 40 MG capsule TAKE 1 CAPSULE BY MOUTH EVERY DAY 90 capsule 0   sertraline (ZOLOFT) 50 MG tablet TAKE 1 TABLET BY MOUTH EVERY DAY 90 tablet 2   tadalafil (CIALIS) 20 MG tablet TAKE 1/2 TO 1 TABLET BY MOUTH DAILY AS NEEDED FOR ERECTILE DYSFUNCTION. 4 tablet 19   No current facility-administered medications on file prior to visit.   Past Medical History:  Diagnosis Date   Allergy    Anxiety    Arthritis    Depression    GERD (gastroesophageal reflux disease)    HLD (hyperlipidemia)    Hypertension    Sleep apnea    Sleep apnea with use of continuous positive airway pressure (CPAP)    uses  CPAP nightly   Allergies  Allergen Reactions   Erythromycin Rash    Social History   Socioeconomic History   Marital status: Married    Spouse name: Harold Brown   Number of children: 0   Years of education: Not on file   Highest education level: Bachelor's degree (e.g., BA, AB, BS)  Occupational History   Occupation: retired  Tobacco Use   Smoking status: Never   Smokeless tobacco: Never  Vaping Use   Vaping Use: Never used  Substance and Sexual Activity   Alcohol use: Yes    Comment: 2-3 a day/ 2-3 times per [facility]   Drug use: Never   Sexual activity: Yes  Other Topics Concern   Not on file  Social History Narrative   Not on  file   Social Determinants of Health   Financial Resource Strain: Low Risk  (12/24/2021)   Overall Financial Resource Strain (CARDIA)    Difficulty of Paying Living Expenses: Not hard at all  Food Insecurity: No Food Insecurity (12/24/2021)   Hunger Vital Sign    Worried About Running Out of Food in the Last Year: Never true    Ran Out of Food in the Last Year: Never true  Transportation Needs: No Transportation Needs (12/24/2021)   PRAPARE - Hydrologist (Medical): No    Lack of Transportation (Non-Medical): No  Physical Activity: Insufficiently Active (12/24/2021)   Exercise Vital Sign    Days of Exercise per Week: 3 days    Minutes of Exercise per Session: 30 min  Stress: No Stress Concern Present (12/24/2021)   [place]    Feeling of Stress : Not at all  Social Connections: Unknown (12/24/2021)   Social Connection and Isolation Panel [NHANES]    Frequency of Communication with Friends and Family: Once a week    Frequency of Social Gatherings with Friends and Family: Not on file    Attends Religious Services: Never    Marine scientist or Organizations: Yes    Attends Archivist Meetings: Never    Marital Status: Married   Vitals:   12/28/21 1407  BP: 122/80  Pulse: 70  Resp: 16  SpO2: 97%   Body mass index is 23.79 kg/m.  Physical Exam Vitals and nursing note reviewed.  Constitutional:      General: He is not in acute distress.    Appearance: He is well-developed.  HENT:     Head: Normocephalic and atraumatic.     Mouth/Throat:     Mouth: Mucous membranes are moist.     Pharynx: Oropharynx is clear.  Eyes:     Conjunctiva/sclera: Conjunctivae normal.  Cardiovascular:     Rate and Rhythm: Normal rate and regular rhythm.     Pulses:          Dorsalis pedis pulses are 2+ on the right side and 2+ on the left side.     Heart sounds: No murmur  heard. Pulmonary:     Effort: Pulmonary effort is normal. No respiratory distress.     Breath sounds: Normal breath sounds.  Abdominal:     Palpations: Abdomen is soft. There is no hepatomegaly or mass.     Tenderness: There is no abdominal tenderness.  Lymphadenopathy:     Cervical: No cervical adenopathy.  Skin:    General: Skin is warm.     Findings: No erythema or rash.  Comments: No suspicious lesions on areas of concern, pretibial, bilateral.  Neurological:     Mental Status: He is alert and oriented to person, place, and time.     Cranial Nerves: No cranial nerve deficit.     Gait: Gait normal.  Psychiatric:        Mood and Affect: Affect normal. Mood is anxious.   ASSESSMENT AND PLAN:  [name] was seen today for follow-up.  Diagnoses and all orders for this visit: Lab Results  Component Value Date   CREATININE 0.98 06/27/2021   BUN 14 06/27/2021   NA 141 06/27/2021   K 3.6 06/27/2021   CL 105 06/27/2021   CO2 25 06/27/2021   Lab Results  Component Value Date   ALT 21 12/27/2020   AST 22 12/27/2020   ALKPHOS 81 12/27/2020   BILITOT 0.8 12/27/2020   Lab Results  Component Value Date   CHOL 179 12/27/2020   HDL 67.70 12/27/2020   LDLCALC 85 12/27/2020   LDLDIRECT 140.0 09/03/2017   TRIG 133.0 12/27/2020   CHOLHDL 3 12/27/2020   Insomnia, unspecified type Assessment & Plan: It has improved. Continue Alprazolam 0.5 mg at bedtime as needed. Good sleep hygiene to continue.   Hypertension, essential, benign Assessment & Plan: Well controlled. Continue Amlodipine-Olmesartan 5-40 mg daily and Metoprolol succinate 100 mg daily. Continue monitoring BP at home. Eye exam is current.  Orders: -     Comprehensive metabolic panel; Future  Hyperlipidemia, mixed Assessment & Plan: Continue atorvastatin 20 mg daily and low-fat diet. Further recommendation will be given according to lab results.  Orders: -     Comprehensive metabolic panel; Future -      Lipid panel; Future  GAD (generalized anxiety disorder) Assessment & Plan: Problem is stable. Continue alprazolam 0.5 mg twice daily as needed and sertraline 50 mg daily. Follow-up in 5 to 6 months, before if needed.   Need for influenza vaccination -     Flu Vaccine QUAD High Dose(Fluad)  Need for pneumococcal 20-valent conjugate vaccination -     Pneumococcal conjugate vaccine 20-valent   Return in about 6 months (around 06/29/2022) for chronic problems.  Boluwatife Mutchler G. Martinique, MD  [facility]. [place] office.

## 2021-12-28 ENCOUNTER — Encounter: Payer: Self-pay | Admitting: Family Medicine

## 2021-12-28 ENCOUNTER — Ambulatory Visit (INDEPENDENT_AMBULATORY_CARE_PROVIDER_SITE_OTHER): Payer: Medicare HMO | Admitting: Family Medicine

## 2021-12-28 VITALS — BP 122/80 | HR 70 | Resp 16 | Ht 72.0 in | Wt 175.4 lb

## 2021-12-28 DIAGNOSIS — Z23 Encounter for immunization: Secondary | ICD-10-CM | POA: Diagnosis not present

## 2021-12-28 DIAGNOSIS — I10 Essential (primary) hypertension: Secondary | ICD-10-CM | POA: Diagnosis not present

## 2021-12-28 DIAGNOSIS — E782 Mixed hyperlipidemia: Secondary | ICD-10-CM | POA: Diagnosis not present

## 2021-12-28 DIAGNOSIS — F411 Generalized anxiety disorder: Secondary | ICD-10-CM

## 2021-12-28 DIAGNOSIS — G4700 Insomnia, unspecified: Secondary | ICD-10-CM | POA: Diagnosis not present

## 2021-12-28 LAB — COMPREHENSIVE METABOLIC PANEL
ALT: 24 U/L (ref 0–53)
AST: 26 U/L (ref 0–37)
Albumin: 4.6 g/dL (ref 3.5–5.2)
Alkaline Phosphatase: 81 U/L (ref 39–117)
BUN: 22 mg/dL (ref 6–23)
CO2: 30 mEq/L (ref 19–32)
Calcium: 9.7 mg/dL (ref 8.4–10.5)
Chloride: 104 mEq/L (ref 96–112)
Creatinine, Ser: 1.04 mg/dL (ref 0.40–1.50)
GFR: 75.36 mL/min (ref >60.00)
Glucose, Bld: 97 mg/dL (ref 70–99)
Potassium: 3.6 mEq/L (ref 3.5–5.1)
Sodium: 142 mEq/L (ref 135–145)
Total Bilirubin: 0.6 mg/dL (ref 0.2–1.2)
Total Protein: 7.1 g/dL (ref 6.0–8.3)

## 2021-12-28 LAB — LIPID PANEL
Cholesterol: 164 mg/dL (ref 0–200)
HDL: 62.1 mg/dL (ref >39.00)
LDL Cholesterol: 79 mg/dL (ref 0–99)
NonHDL: 102.12
Total CHOL/HDL Ratio: 3
Triglycerides: 114 mg/dL (ref 0.0–149.0)
VLDL: 22.8 mg/dL (ref 0.0–40.0)

## 2021-12-28 NOTE — Assessment & Plan Note (Signed)
It has improved. Continue Alprazolam 0.5 mg at bedtime as needed. Good sleep hygiene to continue.

## 2021-12-28 NOTE — Assessment & Plan Note (Addendum)
Well controlled. Continue Amlodipine-Olmesartan 5-40 mg daily and Metoprolol succinate 100 mg daily. Continue monitoring BP at home. Eye exam is current.

## 2021-12-28 NOTE — Assessment & Plan Note (Signed)
Continue atorvastatin 20 mg daily and low-fat diet. Further recommendation will be given according to lab results.

## 2021-12-28 NOTE — Patient Instructions (Signed)
A few things to remember from today's visit:  Hypertension, essential, benign - Plan: Comprehensive metabolic panel  Insomnia, unspecified type  Hyperlipidemia, mixed - Plan: Comprehensive metabolic panel, Lipid panel  GAD (generalized anxiety disorder)  No changes today. Try to engage in daily walks as you did before.  If you need refills for medications you take chronically, please call your pharmacy. Do not use My Chart to request refills or for acute issues that need immediate attention. If you send a my chart message, it may take a few days to be addressed, specially if I am not in the office.  Please be sure medication list is accurate. If a new problem present, please set up appointment sooner than planned today.

## 2021-12-28 NOTE — Assessment & Plan Note (Signed)
Problem is stable. Continue alprazolam 0.5 mg twice daily as needed and sertraline 50 mg daily. Follow-up in 5 to 6 months, before if needed.

## 2021-12-31 ENCOUNTER — Telehealth: Payer: Self-pay | Admitting: Orthopaedic Surgery

## 2021-12-31 NOTE — Telephone Encounter (Signed)
Please advise. Patient last had gel injections 05/2021.  OK to resubmit in January?

## 2021-12-31 NOTE — Telephone Encounter (Signed)
Patient called asked if he can get set up for the gel injections again in both of his knees? The number to contact patient is [phone]

## 2021-12-31 NOTE — Telephone Encounter (Signed)
Yes thanks 

## 2022-01-01 NOTE — Telephone Encounter (Signed)
Please authorize for bilat knee gel injections-Xu  I called patient and advised we would not submit until after January. We discussed his insurance and he is going to bring his card to the office so we will have his updated information in the system to submit. He has Medicare A and B and [employer] as supplement.

## 2022-01-01 NOTE — Telephone Encounter (Signed)
Noted! Thank you

## 2022-01-08 ENCOUNTER — Other Ambulatory Visit: Payer: Self-pay | Admitting: Gastroenterology

## 2022-01-09 NOTE — Telephone Encounter (Signed)
Needs appt

## 2022-01-18 ENCOUNTER — Telehealth: Payer: Self-pay | Admitting: Orthopaedic Surgery

## 2022-01-18 NOTE — Telephone Encounter (Signed)
Noted.  VOB submitted for Euflexxa, bilateral knee.

## 2022-01-18 NOTE — Telephone Encounter (Signed)
Patient has brought his new insurance card for his Gel injections

## 2022-01-22 ENCOUNTER — Telehealth: Payer: Self-pay

## 2022-01-22 NOTE — Telephone Encounter (Signed)
PA required for Euflexxa.  PA has been submitted through Euflexxa portal. PA pending

## 2022-02-08 ENCOUNTER — Other Ambulatory Visit: Payer: Self-pay

## 2022-02-08 DIAGNOSIS — M170 Bilateral primary osteoarthritis of knee: Secondary | ICD-10-CM

## 2022-02-09 ENCOUNTER — Other Ambulatory Visit: Payer: Self-pay | Admitting: Family Medicine

## 2022-02-22 ENCOUNTER — Encounter: Payer: Self-pay | Admitting: Orthopaedic Surgery

## 2022-02-22 ENCOUNTER — Ambulatory Visit: Payer: Medicare HMO | Admitting: Physician Assistant

## 2022-02-22 DIAGNOSIS — M1711 Unilateral primary osteoarthritis, right knee: Secondary | ICD-10-CM

## 2022-02-22 DIAGNOSIS — M1712 Unilateral primary osteoarthritis, left knee: Secondary | ICD-10-CM

## 2022-02-22 DIAGNOSIS — M170 Bilateral primary osteoarthritis of knee: Secondary | ICD-10-CM

## 2022-02-22 MED ORDER — SODIUM HYALURONATE (VISCOSUP) 20 MG/2ML IX SOSY
20.0000 mg | PREFILLED_SYRINGE | INTRA_ARTICULAR | Status: AC | PRN
  Administered 2022-02-22: 20 mg via INTRA_ARTICULAR

## 2022-02-22 MED ORDER — LIDOCAINE HCL 1 % IJ SOLN
3.0000 mL | INTRAMUSCULAR | Status: AC | PRN
  Administered 2022-02-22: 3 mL

## 2022-02-22 MED ORDER — BUPIVACAINE HCL 0.25 % IJ SOLN
0.6600 mL | INTRAMUSCULAR | Status: AC | PRN
  Administered 2022-02-22: .66 mL via INTRA_ARTICULAR

## 2022-02-22 NOTE — Progress Notes (Signed)
Office Visit Note   Patient: Harold Brown           Date of Birth: 1955/12/30           MRN: [id number] Visit Date: 02/22/2022              Requested by: Martinique, Betty G, MD [address] [place],  [place] [address] PCP: Martinique, Betty G, MD   Assessment & Plan: Visit Diagnoses:  1. Bilateral primary osteoarthritis of knee     Plan: Impression is bilateral knee osteoarthritis.  Today, we proceeded with bilateral knee Euflexxa injections.  He tolerated these well.  He will follow-up next week for Euflexxa No. 2 injections. Euflexxa lot number [id number] Expiration date 12/17/2022  Follow-Up Instructions: Return in about 1 week (around 03/01/2022).   Orders:  Orders Placed This Encounter  Procedures   Large Joint Inj   No orders of the defined types were placed in this encounter.     Procedures: Large Joint Inj: bilateral knee on 02/22/2022 10:45 AM Indications: pain Details: 22 G needle, anterolateral approach Medications (Right): 0.66 mL bupivacaine 0.25 %; 3 mL lidocaine 1 %; 20 mg Sodium Hyaluronate (Viscosup) 20 MG/2ML Medications (Left): 3 mL lidocaine 1 %; 0.66 mL bupivacaine 0.25 %; 20 mg Sodium Hyaluronate (Viscosup) 20 MG/2ML      Clinical Data: No additional findings.   Subjective: Chief Complaint  Patient presents with   Right Knee - Follow-up    Euflexxa #1   Left Knee - Follow-up    Euflexxa #1    HPI patient is a very pleasant 67 year old gentleman with underlying bilateral knee osteoarthritis who comes in today for Euflexxa injection #1 to both knees.  He has previously undergone viscosupplementation injections over 6 months ago which did help.     Objective: Vital Signs: There were no vitals taken for this visit.    Ortho Exam stable bilateral knee exams without effusion  Specialty Comments:  No specialty comments available.  Imaging: No new imaging   PMFS History: Patient Active Problem List   Diagnosis Date Noted    Seborrheic dermatitis of scalp 06/27/2021   Erectile dysfunction 09/03/2019   Synovitis of knee 03/10/2019   Gastroesophageal reflux disease with esophagitis without hemorrhage 12/04/2018   Acute medial meniscus tear of right knee 11/27/2018   Acute medial meniscus tear of left knee 11/27/2018   Hyperlipidemia, mixed 123XX123   Periumbilical pain 123456   History of colonic polyps 01/27/2018   Cystic acne vulgaris 11/03/2017   Insomnia 11/03/2017   GAD (generalized anxiety disorder) 11/03/2017   Hypertension, essential, benign 11/03/2017   Bilateral primary osteoarthritis of knee 09/11/2017   Past Medical History:  Diagnosis Date   Allergy    Anxiety    Arthritis    Depression    GERD (gastroesophageal reflux disease)    HLD (hyperlipidemia)    Hypertension    Sleep apnea    Sleep apnea with use of continuous positive airway pressure (CPAP)    uses CPAP nightly    Family History  Problem Relation Age of Onset   Arthritis Mother    Breast cancer Mother    Diabetes Father    Heart disease Father    Colon cancer Neg Hx    Esophageal cancer Neg Hx    Inflammatory bowel disease Neg Hx    Liver disease Neg Hx    Pancreatic cancer Neg Hx    Rectal cancer Neg Hx    Stomach cancer Neg  Hx     Past Surgical History:  Procedure Laterality Date   BICEPS TENDON REPAIR Right 11/2015   upper   broken finger Right    Bone fusion from the broken finger   CHONDROPLASTY Right 03/10/2019   Procedure: CHONDROPLASTY;  Surgeon: Leandrew Koyanagi, MD;  Location: [facility];  Service: Orthopedics;  Laterality: Right;   KNEE ARTHROSCOPY WITH MEDIAL MENISECTOMY Left 01/01/2019   Procedure: LEFT KNEE ARTHROSCOPY WITH PARTIAL MEDIAL AND LATERAL MENISCECTOMY;  Surgeon: Leandrew Koyanagi, MD;  Location: [facility];  Service: Orthopedics;  Laterality: Left;   KNEE ARTHROSCOPY WITH MEDIAL MENISECTOMY Right 03/10/2019   Procedure: RIGHT KNEE ARTHROSCOPY WITH PARTIAL MEDIAL  MENISCECTOMY;  Surgeon: Leandrew Koyanagi, MD;  Location: [facility];  Service: Orthopedics;  Laterality: Right;   ROTATOR CUFF REPAIR Right 2013, 2017   Social History   Occupational History   Occupation: retired  Tobacco Use   Smoking status: Never   Smokeless tobacco: Never  Vaping Use   Vaping Use: Never used  Substance and Sexual Activity   Alcohol use: Yes    Comment: 2-3 a day/ 2-3 times per [facility]   Drug use: Never   Sexual activity: Yes

## 2022-03-05 ENCOUNTER — Encounter: Payer: Self-pay | Admitting: Orthopaedic Surgery

## 2022-03-05 ENCOUNTER — Ambulatory Visit: Payer: Medicare HMO | Admitting: Orthopaedic Surgery

## 2022-03-05 DIAGNOSIS — M170 Bilateral primary osteoarthritis of knee: Secondary | ICD-10-CM | POA: Diagnosis not present

## 2022-03-05 MED ORDER — BUPIVACAINE HCL 0.5 % IJ SOLN
2.0000 mL | INTRAMUSCULAR | Status: AC | PRN
  Administered 2022-03-05: 2 mL via INTRA_ARTICULAR

## 2022-03-05 MED ORDER — SODIUM HYALURONATE (VISCOSUP) 20 MG/2ML IX SOSY
20.0000 mg | PREFILLED_SYRINGE | INTRA_ARTICULAR | Status: AC | PRN
  Administered 2022-03-05: 20 mg via INTRA_ARTICULAR

## 2022-03-05 MED ORDER — LIDOCAINE HCL 1 % IJ SOLN
2.0000 mL | INTRAMUSCULAR | Status: AC | PRN
  Administered 2022-03-05: 2 mL

## 2022-03-05 NOTE — Progress Notes (Signed)
Office Visit Note   Patient: Harold Brown           Date of Birth: [birth year]           MRN: [id number] Visit Date: 03/05/2022              Requested by: Martinique, Betty G, MD [address] [place],  [place] [address] PCP: Martinique, Betty G, MD   Assessment & Plan: Visit Diagnoses:  1. Bilateral primary osteoarthritis of knee     Plan: Impression is bilateral knee osteoarthritis.  Today, proceed with bilateral knee Euflexxa No. 2 injections.  He tolerated these well.  He will follow-up with [place] next week for his third set of injections. Euflexxa Lot number [id number], expiration date 12/17/2022  Follow-Up Instructions: Return if symptoms worsen or fail to improve.   Orders:  No orders of the defined types were placed in this encounter.  No orders of the defined types were placed in this encounter.     Procedures: Large Joint Inj: bilateral knee on 03/05/2022 7:38 PM Indications: pain Details: 22 G needle  Arthrogram: No  Medications (Right): 2 mL lidocaine 1 %; 2 mL bupivacaine 0.5 %; 20 mg Sodium Hyaluronate (Viscosup) 20 MG/2ML Medications (Left): 2 mL lidocaine 1 %; 2 mL bupivacaine 0.5 %; 20 mg Sodium Hyaluronate (Viscosup) 20 MG/2ML Outcome: tolerated well, no immediate complications Patient was prepped and draped in the usual sterile fashion.       Clinical Data: No additional findings.   Subjective: Chief Complaint  Patient presents with   Right Knee - Follow-up    Euflexxa #2   Left Knee - Follow-up    Euflexxa #2    HPI patient is a pleasant 67 year old gentleman who comes in today for Euflexxa No. 2 injections to both knees.  History of underlying bilateral knee osteoarthritis.  He was seen in our office about a week ago where his first Euflexxa injections were performed.  He tolerated these well and he has noticed slight improvement in symptoms since.     Objective: Vital Signs: There were no vitals taken for this visit.    Ortho  Exam unchanged bilateral knee exam  Specialty Comments:  No specialty comments available.  Imaging: No new imaging   PMFS History: Patient Active Problem List   Diagnosis Date Noted   Seborrheic dermatitis of scalp 06/27/2021   Erectile dysfunction 09/03/2019   Synovitis of knee 03/10/2019   Gastroesophageal reflux disease with esophagitis without hemorrhage 12/04/2018   Acute medial meniscus tear of right knee 11/27/2018   Acute medial meniscus tear of left knee 11/27/2018   Hyperlipidemia, mixed 123XX123   Periumbilical pain 123456   History of colonic polyps 01/27/2018   Cystic acne vulgaris 11/03/2017   Insomnia 11/03/2017   GAD (generalized anxiety disorder) 11/03/2017   Hypertension, essential, benign 11/03/2017   Bilateral primary osteoarthritis of knee 09/11/2017   Past Medical History:  Diagnosis Date   Allergy    Anxiety    Arthritis    Depression    GERD (gastroesophageal reflux disease)    HLD (hyperlipidemia)    Hypertension    Sleep apnea    Sleep apnea with use of continuous positive airway pressure (CPAP)    uses CPAP nightly    Family History  Problem Relation Age of Onset   Arthritis Mother    Breast cancer Mother    Diabetes Father    Heart disease Father    Colon cancer Neg  Hx    Esophageal cancer Neg Hx    Inflammatory bowel disease Neg Hx    Liver disease Neg Hx    Pancreatic cancer Neg Hx    Rectal cancer Neg Hx    Stomach cancer Neg Hx     Past Surgical History:  Procedure Laterality Date   BICEPS TENDON REPAIR Right 11/2015   upper   broken finger Right    Bone fusion from the broken finger   CHONDROPLASTY Right 03/10/2019   Procedure: CHONDROPLASTY;  Surgeon: Leandrew Koyanagi, MD;  Location: [facility];  Service: Orthopedics;  Laterality: Right;   KNEE ARTHROSCOPY WITH MEDIAL MENISECTOMY Left 01/01/2019   Procedure: LEFT KNEE ARTHROSCOPY WITH PARTIAL MEDIAL AND LATERAL MENISCECTOMY;  Surgeon: Leandrew Koyanagi, MD;   Location: [place];  Service: Orthopedics;  Laterality: Left;   KNEE ARTHROSCOPY WITH MEDIAL MENISECTOMY Right 03/10/2019   Procedure: RIGHT KNEE ARTHROSCOPY WITH PARTIAL MEDIAL MENISCECTOMY;  Surgeon: Leandrew Koyanagi, MD;  Location: [facility];  Service: Orthopedics;  Laterality: Right;   ROTATOR CUFF REPAIR Right 2013, 2017   Social History   Occupational History   Occupation: retired  Tobacco Use   Smoking status: Never   Smokeless tobacco: Never  Vaping Use   Vaping Use: Never used  Substance and Sexual Activity   Alcohol use: Yes    Comment: 2-3 a day/ 2-3 times per [facility]   Drug use: Never   Sexual activity: Yes

## 2022-03-06 ENCOUNTER — Other Ambulatory Visit: Payer: Self-pay | Admitting: Family Medicine

## 2022-03-06 DIAGNOSIS — G4700 Insomnia, unspecified: Secondary | ICD-10-CM

## 2022-03-06 DIAGNOSIS — F411 Generalized anxiety disorder: Secondary | ICD-10-CM

## 2022-03-14 ENCOUNTER — Ambulatory Visit: Payer: Medicare HMO | Admitting: Orthopaedic Surgery

## 2022-03-14 DIAGNOSIS — M170 Bilateral primary osteoarthritis of knee: Secondary | ICD-10-CM

## 2022-03-14 DIAGNOSIS — M1712 Unilateral primary osteoarthritis, left knee: Secondary | ICD-10-CM | POA: Diagnosis not present

## 2022-03-14 NOTE — Progress Notes (Signed)
Office Visit Note   Patient: Harold Brown           Date of Birth: November 12, 1955           MRN: [id number] Visit Date: 03/14/2022              Requested by: Martinique, Betty G, MD [address] [place],  [place] [address] PCP: Martinique, Betty G, MD   Assessment & Plan: Visit Diagnoses:  1. Bilateral primary osteoarthritis of knee     Plan: Impression is bilateral knee osteoarthritis.  Today we proceeded with Euflexxa No. 3 injections both knees.  He tolerated these well.  He will follow-up with [place] as needed. Euflexxa injection Lot number [id number]; expiration date 12/17/2022  Follow-Up Instructions: Return if symptoms worsen or fail to improve.   Orders:  No orders of the defined types were placed in this encounter.  No orders of the defined types were placed in this encounter.     Procedures: Large Joint Inj: bilateral knee on 03/15/2022 3:24 PM Indications: pain Details: 22 G needle  Arthrogram: No  Medications (Right): 20 mg Sodium Hyaluronate (Viscosup) 20 MG/2ML Medications (Left): 20 mg Sodium Hyaluronate (Viscosup) 20 MG/2ML Outcome: tolerated well, no immediate complications Patient was prepped and draped in the usual sterile fashion.       Clinical Data: No additional findings.   Subjective: No chief complaint on file.   HPI patient is a very pleasant 67 year old gentleman with underlying bilateral knee osteoarthritis.  He comes in today for Euflexxa No. 3 injection to both knees.  He has tolerated the first 2 injections well and has noticed a fair amount of relief since starting the series 2 weeks ago.     Objective: Vital Signs: There were no vitals taken for this visit.    Ortho Exam unchanged bilateral knee exam  Specialty Comments:  No specialty comments available.  Imaging: No new imaging   PMFS History: Patient Active Problem List   Diagnosis Date Noted   Seborrheic dermatitis of scalp 06/27/2021   Erectile dysfunction  09/03/2019   Synovitis of knee 03/10/2019   Gastroesophageal reflux disease with esophagitis without hemorrhage 12/04/2018   Acute medial meniscus tear of right knee 11/27/2018   Acute medial meniscus tear of left knee 11/27/2018   Hyperlipidemia, mixed 123XX123   Periumbilical pain 123456   History of colonic polyps 01/27/2018   Cystic acne vulgaris 11/03/2017   Insomnia 11/03/2017   GAD (generalized anxiety disorder) 11/03/2017   Hypertension, essential, benign 11/03/2017   Bilateral primary osteoarthritis of knee 09/11/2017   Past Medical History:  Diagnosis Date   Allergy    Anxiety    Arthritis    Depression    GERD (gastroesophageal reflux disease)    HLD (hyperlipidemia)    Hypertension    Sleep apnea    Sleep apnea with use of continuous positive airway pressure (CPAP)    uses CPAP nightly    Family History  Problem Relation Age of Onset   Arthritis Mother    Breast cancer Mother    Diabetes Father    Heart disease Father    Colon cancer Neg Hx    Esophageal cancer Neg Hx    Inflammatory bowel disease Neg Hx    Liver disease Neg Hx    Pancreatic cancer Neg Hx    Rectal cancer Neg Hx    Stomach cancer Neg Hx     Past Surgical History:  Procedure Laterality Date   BICEPS  TENDON REPAIR Right 11/2015   upper   broken finger Right    Bone fusion from the broken finger   CHONDROPLASTY Right 03/10/2019   Procedure: CHONDROPLASTY;  Surgeon: Leandrew Koyanagi, MD;  Location: [facility];  Service: Orthopedics;  Laterality: Right;   KNEE ARTHROSCOPY WITH MEDIAL MENISECTOMY Left 01/01/2019   Procedure: LEFT KNEE ARTHROSCOPY WITH PARTIAL MEDIAL AND LATERAL MENISCECTOMY;  Surgeon: Leandrew Koyanagi, MD;  Location: [facility];  Service: Orthopedics;  Laterality: Left;   KNEE ARTHROSCOPY WITH MEDIAL MENISECTOMY Right 03/10/2019   Procedure: RIGHT KNEE ARTHROSCOPY WITH PARTIAL MEDIAL MENISCECTOMY;  Surgeon: Leandrew Koyanagi, MD;  Location: [place];  Service: Orthopedics;  Laterality: Right;   ROTATOR CUFF REPAIR Right 2013, 2017   Social History   Occupational History   Occupation: retired  Tobacco Use   Smoking status: Never   Smokeless tobacco: Never  Vaping Use   Vaping Use: Never used  Substance and Sexual Activity   Alcohol use: Yes    Comment: 2-3 a day/ 2-3 times per [facility]   Drug use: Never   Sexual activity: Yes

## 2022-03-15 DIAGNOSIS — M170 Bilateral primary osteoarthritis of knee: Secondary | ICD-10-CM | POA: Diagnosis not present

## 2022-03-15 DIAGNOSIS — M1711 Unilateral primary osteoarthritis, right knee: Secondary | ICD-10-CM | POA: Diagnosis not present

## 2022-03-15 MED ORDER — SODIUM HYALURONATE (VISCOSUP) 20 MG/2ML IX SOSY
20.0000 mg | PREFILLED_SYRINGE | INTRA_ARTICULAR | Status: AC | PRN
  Administered 2022-03-15: 20 mg via INTRA_ARTICULAR

## 2022-04-08 ENCOUNTER — Other Ambulatory Visit: Payer: Self-pay | Admitting: Family Medicine

## 2022-04-08 ENCOUNTER — Other Ambulatory Visit: Payer: Self-pay | Admitting: Gastroenterology

## 2022-04-26 NOTE — Progress Notes (Unsigned)
HPI: [name] is a 67 y.o.male with PMHx significant for HLD,HTN,and anxiety here today for his welcome to medicare visit and follow up.  Independent ADL's and IADL's. Never smoker. He has not been exercising regularly for the past 6 weeks due to right ankle sprain. He is active with chores around his house.  His dietary habits are described as healthy, with an emphasis on fruit and vegetable consumption and a conscious effort to avoid excessive snacking.  Functional Status Survey: Is the patient deaf or have difficulty hearing?: No Does the patient have difficulty seeing, even when wearing glasses/contacts?: No Does the patient have difficulty concentrating, remembering, or making decisions?: No Does the patient have difficulty walking or climbing stairs?: No Does the patient have difficulty dressing or bathing?: No Does the patient have difficulty doing errands alone such as visiting a doctor's office or shopping?: No     04/29/2022    2:19 PM 12/28/2021    2:17 PM 12/24/2021   12:56 PM 12/27/2020    2:14 PM 09/04/2018   12:24 PM  Fall Risk   Falls in the past year? 0 0 0 0 0  Number falls in past yr: 0 0   0  Injury with Fall? 0 0   0  Risk for fall due to : No Fall Risks No Fall Risks     Follow up Falls evaluation completed Falls evaluation completed      Providers he sees regularly: Eye care provider: Not sure about providers name, every 2 years. Ortho, Dr Roda Shutters, for knee OA Dentist 2 times per year.    04/29/2022    2:20 PM  Depression screen PHQ 2/9  Decreased Interest 0  Down, Depressed, Hopeless 0  PHQ - 2 Score 0    Mini-Cog - 04/29/22 1442     How many words correct? 3            Vision Screening   Right eye Left eye Both eyes  Without correction     With correction  Hearing Screening - Comments:: Hearing grossly intact.  Immunization History  Administered Date(s) Administered   Fluad Quad(high Dose 65+) 12/28/2021    Influenza,inj,Quad PF,6+ Mos 11/03/2017, 03/01/2019, 02/07/2020, 12/27/2020   PNEUMOCOCCAL CONJUGATE-20 12/28/2021   Tdap 12/05/2017   Zoster Recombinat (Shingrix) 03/01/2019, 06/28/2019   Health Maintenance  Topic Date Due   COVID-19 Vaccine (1) 05/15/2022 (Originally 01/02/1956)   HIV Screening  09/03/2025 (Originally 07/04/1970)   INFLUENZA VACCINE  08/15/2022   COLONOSCOPY (Pts 45-48yrs Insurance coverage will need to be confirmed)  02/11/2023   Medicare Annual Wellness (AWV)  04/29/2023   DTaP/Tdap/Td (2 - Td or Tdap) 12/06/2027   Pneumonia Vaccine 23+ Years old  Completed   Hepatitis C Screening  Completed   Zoster Vaccines- Shingrix  Completed   HPV VACCINES  Aged Out   Hypertension: He is on Metoprolol succinate 100 mg daily and Amlodipine-Olmesartan 5-40 mg daily.  BP readings at home:120-130/70-80. Negative for unusual or severe headache, visual changes, exertional chest pain, dyspnea,  focal weakness, or edema.  Lab Results  Component Value Date   CREATININE 1.04 12/28/2021   BUN 22 12/28/2021   NA 142 12/28/2021   K 3.6 12/28/2021   CL 104 12/28/2021   CO2 30 12/28/2021   HLD on Atorvastatin 20 mg daily. Lab Results  Component Value Date   CHOL 164 12/28/2021   HDL 62.10 12/28/2021   LDLCALC 79 12/28/2021   LDLDIRECT 140.0 09/03/2017  TRIG 114.0 12/28/2021   CHOLHDL 3 12/28/2021   Anxiety and insomnia:He is on Sertraline 50 mg daily and Alprazolam 0.5 mg bid prn. Tolerating medication well.  Review of Systems  Constitutional:  Negative for activity change, appetite change and fever.  HENT:  Negative for nosebleeds, sore throat and trouble swallowing.   Respiratory:  Negative for cough and wheezing.   Gastrointestinal:  Negative for abdominal pain, nausea and vomiting.  Endocrine: Negative for cold intolerance and heat intolerance.  Genitourinary:  Negative for decreased urine volume, dysuria and hematuria.  Musculoskeletal:  Positive for arthralgias.   Neurological:  Negative for syncope and facial asymmetry.  Psychiatric/Behavioral:  Negative for confusion and hallucinations.    Current Outpatient Medications on File Prior to Visit  Medication Sig Dispense Refill   ALPRAZolam (XANAX) 0.5 MG tablet TAKE 1 TABLET BY MOUTH TWICE A DAY AS NEEDED FOR ANXIETY 60 tablet 3   amLODipine-olmesartan (AZOR) 5-40 MG tablet TAKE 1 TABLET BY MOUTH EVERY DAY 90 tablet 2   Ascorbic Acid (VITAMIN C ADULT GUMMIES PO) Take by mouth 2 (two) times daily.     atorvastatin (LIPITOR) 20 MG tablet TAKE 1 TABLET BY MOUTH EVERY DAY 90 tablet 3   diclofenac sodium (VOLTAREN) 1 % GEL Apply 2 g topically 4 (four) times daily. 350 g 6   metoprolol succinate (TOPROL-XL) 100 MG 24 hr tablet TAKE 1 TABLET BY MOUTH EVERY DAY WITH OR IMMEDIATELY FOLLOWING A MEAL 90 tablet 1   Multiple Vitamins-Minerals (HM MULTIVITAMIN ADULT GUMMY PO) Take by mouth 2 (two) times daily.     nystatin-triamcinolone (MYCOLOG II) cream APPLY 1 APPLICATION TOPICALLY TWICE A DAY AS NEEDED 30 g 3   omeprazole (PRILOSEC) 40 MG capsule TAKE 1 CAPSULE BY MOUTH EVERY DAY 90 capsule 0   sertraline (ZOLOFT) 50 MG tablet TAKE 1 TABLET BY MOUTH EVERY DAY 90 tablet 2   No current facility-administered medications on file prior to visit.   Past Medical History:  Diagnosis Date   Allergy    Anxiety    Arthritis    Depression    GERD (gastroesophageal reflux disease)    HLD (hyperlipidemia)    Hypertension    Sleep apnea    Sleep apnea with use of continuous positive airway pressure (CPAP)    uses CPAP nightly   Past Surgical History:  Procedure Laterality Date   BICEPS TENDON REPAIR Right 11/2015   upper   broken finger Right    Bone fusion from the broken finger   CHONDROPLASTY Right 03/10/2019   Procedure: CHONDROPLASTY;  Surgeon: Tarry Kos, MD;  Location: [facility];  Service: Orthopedics;  Laterality: Right;   KNEE ARTHROSCOPY WITH MEDIAL MENISECTOMY Left 01/01/2019    Procedure: LEFT KNEE ARTHROSCOPY WITH PARTIAL MEDIAL AND LATERAL MENISCECTOMY;  Surgeon: Tarry Kos, MD;  Location: [facility];  Service: Orthopedics;  Laterality: Left;   KNEE ARTHROSCOPY WITH MEDIAL MENISECTOMY Right 03/10/2019   Procedure: RIGHT KNEE ARTHROSCOPY WITH PARTIAL MEDIAL MENISCECTOMY;  Surgeon: Tarry Kos, MD;  Location: [facility];  Service: Orthopedics;  Laterality: Right;   ROTATOR CUFF REPAIR Right 2013, 2017   Allergies  Allergen Reactions   Erythromycin Rash    Family History  Problem Relation Age of Onset   Arthritis Mother    Breast cancer Mother    Diabetes Father    Heart disease Father    Colon cancer Neg Hx    Esophageal cancer Neg Hx  Inflammatory bowel disease Neg Hx    Liver disease Neg Hx    Pancreatic cancer Neg Hx    Rectal cancer Neg Hx    Stomach cancer Neg Hx    Social History   Socioeconomic History   Marital status: Married    Spouse name: Lilyan Gilford   Number of children: 0   Years of education: Not on file   Highest education level: Bachelor's degree (e.g., BA, AB, BS)  Occupational History   Occupation: retired  Tobacco Use   Smoking status: Never   Smokeless tobacco: Never  Vaping Use   Vaping Use: Never used  Substance and Sexual Activity   Alcohol use: Yes    Comment: 2-3 a day/ 2-3 times per [facility]   Drug use: Never   Sexual activity: Yes  Other Topics Concern   Not on file  Social History Narrative   Not on file   Social Determinants of Health   Financial Resource Strain: Low Risk  (12/24/2021)   Overall Financial Resource Strain (CARDIA)    Difficulty of Paying Living Expenses: Not hard at all  Food Insecurity: No Food Insecurity (12/24/2021)   Hunger Vital Sign    Worried About Running Out of Food in the Last Year: Never true    Ran Out of Food in the Last Year: Never true  Transportation Needs: No Transportation Needs (12/24/2021)   PRAPARE - Scientist, research (physical sciences) (Medical): No    Lack of Transportation (Non-Medical): No  Physical Activity: Insufficiently Active (12/24/2021)   Exercise Vital Sign    Days of Exercise per Week: 3 days    Minutes of Exercise per Session: 30 min  Stress: No Stress Concern Present (12/24/2021)   [employer] of Occupational Health - Occupational Stress Questionnaire    Feeling of Stress : Not at all  Social Connections: Unknown (12/24/2021)   Social Connection and Isolation Panel [NHANES]    Frequency of Communication with Friends and Family: Once a week    Frequency of Social Gatherings with Friends and Family: Not on file    Attends Religious Services: Never    Database administrator or Organizations: Yes    Attends Banker Meetings: Never    Marital Status: Married   Vitals:   04/29/22 1404  BP: 128/80  Pulse: 65  Resp: 16  Temp: 98.5 F (36.9 C)  SpO2: 98%   Body mass index is 23.11 kg/m.  Wt Readings from Last 3 Encounters:  04/29/22 170 lb 6 oz (77.3 kg)  12/28/21 175 lb 6 oz (79.5 kg)  06/27/21 179 lb 8 oz (81.4 kg)   Physical Exam Nursing note reviewed.  Constitutional:      General: He is not in acute distress.    Appearance: He is well-developed.  HENT:     Head: Normocephalic and atraumatic.     Mouth/Throat:     Mouth: Mucous membranes are moist.     Pharynx: Oropharynx is clear.  Eyes:     Conjunctiva/sclera: Conjunctivae normal.  Cardiovascular:     Rate and Rhythm: Normal rate and regular rhythm.     Pulses:          Dorsalis pedis pulses are 2+ on the right side and 2+ on the left side.     Heart sounds: No murmur heard. Pulmonary:     Effort: Pulmonary effort is normal. No respiratory distress.     Breath sounds: Normal breath sounds.  Abdominal:  Palpations: Abdomen is soft. There is no hepatomegaly or mass.     Tenderness: There is no abdominal tenderness.  Lymphadenopathy:     Cervical: No cervical adenopathy.  Skin:    General:  Skin is warm.     Findings: No erythema or rash.  Neurological:     Mental Status: He is alert and oriented to person, place, and time.     Gait: Gait normal.  Psychiatric:        Mood and Affect: Mood and affect normal.   ASSESSMENT AND PLAN:  Mr.Lewayne was seen today for welcome to medicare and follow-up.  Diagnoses and all orders for this visit:  Welcome to Medicare preventive visit Assessment & Plan: We discussed the importance of staying active, physically and mentally, as well as the benefits of a healthy/balance diet. Low impact exercise that involve stretching and strengthing are ideal. Vaccines up to date. We discussed preventive screening for the next 5-10 years, summery of recommendations given in AVS. Health Maintenance  Topic Date Due   COVID-19 Vaccine (1) 05/15/2022*   HIV Screening  09/03/2025*   Flu Shot  08/15/2022   Colon Cancer Screening  02/11/2023   Medicare Annual Wellness Visit  04/29/2023   DTaP/Tdap/Td vaccine (2 - Td or Tdap) 12/06/2027   Pneumonia Vaccine  Completed   Hepatitis C Screening: USPSTF Recommendation to screen - Ages 85-79 yo.  Completed   Zoster (Shingles) Vaccine  Completed   HPV Vaccine  Aged Out  *Topic was postponed. The date shown is not the original due date.  Fall prevention. Currently, the patient does not have a power of attorney or living will established, forms printed and given to pt to be completed.   Hypertension, essential, benign Assessment & Plan: Problem is well controlled. Continue Amlodipine-Olmesartan 5-40 mg daily and Metoprolol succinate 100 mg daily. Continue monitoring BP at home.   Erectile dysfunction, unspecified erectile dysfunction type Assessment & Plan: Cialis 20 mg daily as needed has helped, so no changes.  Orders: -     Tadalafil; TAKE 1 TABLET BY MOUTH DAILY AS NEEDED FOR ERECTILE DYSFUNCTION.  Dispense: 12 tablet; Refill: 3  GAD (generalized anxiety disorder) Assessment & Plan: Problem  is stable. Continue alprazolam 0.5 mg twice daily prn  and sertraline 50 mg daily. Follow-up in 5 to 6 months.   Return in about 5 months (around 09/29/2022) for chronic problems.  Harold Alipio G. [place], MD  [facility]. [place] office.

## 2022-04-29 ENCOUNTER — Encounter: Payer: Self-pay | Admitting: Family Medicine

## 2022-04-29 ENCOUNTER — Ambulatory Visit (INDEPENDENT_AMBULATORY_CARE_PROVIDER_SITE_OTHER): Payer: Medicare HMO | Admitting: Family Medicine

## 2022-04-29 VITALS — BP 128/80 | HR 65 | Temp 98.5°F | Resp 16 | Ht 72.0 in | Wt 170.4 lb

## 2022-04-29 DIAGNOSIS — N529 Male erectile dysfunction, unspecified: Secondary | ICD-10-CM | POA: Diagnosis not present

## 2022-04-29 DIAGNOSIS — I10 Essential (primary) hypertension: Secondary | ICD-10-CM

## 2022-04-29 DIAGNOSIS — F411 Generalized anxiety disorder: Secondary | ICD-10-CM | POA: Diagnosis not present

## 2022-04-29 DIAGNOSIS — Z0000 Encounter for general adult medical examination without abnormal findings: Secondary | ICD-10-CM

## 2022-04-29 MED ORDER — TADALAFIL 20 MG PO TABS: ORAL_TABLET | ORAL | 3 refills | Status: DC

## 2022-04-29 NOTE — Assessment & Plan Note (Signed)
We discussed the importance of staying active, physically and mentally, as well as the benefits of a healthy/balance diet. Low impact exercise that involve stretching and strengthing are ideal. Vaccines up to date. We discussed preventive screening for the next 5-10 years, summery of recommendations given in AVS. Health Maintenance  Topic Date Due   COVID-19 Vaccine (1) 05/15/2022*   HIV Screening  09/03/2025*   Flu Shot  08/15/2022   Colon Cancer Screening  02/11/2023   Medicare Annual Wellness Visit  04/29/2023   DTaP/Tdap/Td vaccine (2 - Td or Tdap) 12/06/2027   Pneumonia Vaccine  Completed   Hepatitis C Screening: USPSTF Recommendation to screen - Ages 62-79 yo.  Completed   Zoster (Shingles) Vaccine  Completed   HPV Vaccine  Aged Out  *Topic was postponed. The date shown is not the original due date.  Fall prevention. Currently, the patient does not have a power of attorney or living will established, forms printed and given to pt to be completed.

## 2022-04-29 NOTE — Assessment & Plan Note (Signed)
Cialis 20 mg daily as needed has helped, so no changes.

## 2022-04-29 NOTE — Patient Instructions (Addendum)
  Harold Brown , Thank you for taking time to come for your Medicare Wellness Visit. I appreciate your ongoing commitment to your health goals. Please review the following plan we discussed and let me know if I can assist you in the future.   These are the goals we discussed:  Goals      directives     Remember to complete a power of attorney and living will.      Exercise 3x per week (30 min per time)     Low impact exercise 3-5 times per week.        This is a list of the screening recommended for you and due dates:  Health Maintenance  Topic Date Due   COVID-19 Vaccine (1) 05/15/2022*   HIV Screening  09/03/2025*   Flu Shot  08/15/2022   Colon Cancer Screening  02/11/2023   Medicare Annual Wellness Visit  04/29/2023   DTaP/Tdap/Td vaccine (2 - Td or Tdap) 12/06/2027   Pneumonia Vaccine  Completed   Hepatitis C Screening: USPSTF Recommendation to screen - Ages 33-79 yo.  Completed   Zoster (Shingles) Vaccine  Completed   HPV Vaccine  Aged Out  *Topic was postponed. The date shown is not the original due date.   A few things to remember from today's visit:  Medicare annual wellness visit, initial  Hypertension, essential, benign  Welcome to Medicare preventive visit  Erectile dysfunction, unspecified erectile dysfunction type  GAD (generalized anxiety disorder)  No changes today.  If you need refills for medications you take chronically, please call your pharmacy. Do not use My Chart to request refills or for acute issues that need immediate attention. If you send a my chart message, it may take a few days to be addressed, specially if I am not in the office.  Please be sure medication list is accurate. If a new problem present, please set up appointment sooner than planned today.

## 2022-04-29 NOTE — Assessment & Plan Note (Signed)
Problem is stable. Continue alprazolam 0.5 mg twice daily prn  and sertraline 50 mg daily. Follow-up in 5 to 6 months.

## 2022-04-29 NOTE — Assessment & Plan Note (Signed)
Problem is well controlled. Continue Amlodipine-Olmesartan 5-40 mg daily and Metoprolol succinate 100 mg daily. Continue monitoring BP at home.

## 2022-05-07 ENCOUNTER — Other Ambulatory Visit: Payer: Self-pay | Admitting: Family Medicine

## 2022-06-22 ENCOUNTER — Other Ambulatory Visit: Payer: Self-pay | Admitting: Gastroenterology

## 2022-06-28 ENCOUNTER — Ambulatory Visit: Payer: 59 | Admitting: Family Medicine

## 2022-07-10 ENCOUNTER — Other Ambulatory Visit: Payer: Self-pay | Admitting: Family Medicine

## 2022-07-10 ENCOUNTER — Other Ambulatory Visit: Payer: Self-pay | Admitting: Gastroenterology

## 2022-07-10 DIAGNOSIS — G4700 Insomnia, unspecified: Secondary | ICD-10-CM

## 2022-07-10 DIAGNOSIS — F411 Generalized anxiety disorder: Secondary | ICD-10-CM

## 2022-08-10 ENCOUNTER — Other Ambulatory Visit: Payer: Self-pay | Admitting: Family Medicine

## 2022-08-10 DIAGNOSIS — F411 Generalized anxiety disorder: Secondary | ICD-10-CM

## 2022-08-29 ENCOUNTER — Emergency Department (HOSPITAL_COMMUNITY)
Admission: EM | Admit: 2022-08-29 | Discharge: 2022-09-15 | Disposition: E | Payer: Medicare Other | Attending: Emergency Medicine | Admitting: Emergency Medicine

## 2022-08-29 DIAGNOSIS — I4901 Ventricular fibrillation: Secondary | ICD-10-CM

## 2022-08-29 DIAGNOSIS — I469 Cardiac arrest, cause unspecified: Secondary | ICD-10-CM | POA: Diagnosis present

## 2022-08-29 DIAGNOSIS — I10 Essential (primary) hypertension: Secondary | ICD-10-CM | POA: Insufficient documentation

## 2022-08-29 MED ORDER — EPINEPHRINE 1 MG/10ML IJ SOSY
PREFILLED_SYRINGE | INTRAMUSCULAR | Status: AC | PRN
  Administered 2022-08-29: 1 mg via INTRAVENOUS

## 2022-09-15 NOTE — Progress Notes (Signed)
   08/23/2022 2100  Spiritual Encounters  Type of Visit Initial  Care provided to: Family  Referral source Nurse (RN/NT/LPN)  Reason for visit Patient death  OnCall Visit No   Chaplain responded to a call for support. The patient died. The family member was in shock and struggled to believe that he died. It was so sudden and unexpected is how she described the event. I stayed with her as she shared some of their story. I escorted her to view his body she said ashe hoped he did not suffer. She said his good byes and we left. I provided a patient placement card to her.  She had his sister coming to her house so she was not alone tonight.  She was appreciative of the care she received as she departed  Presenter, broadcasting [facility]  [phone]

## 2022-09-15 NOTE — ED Notes (Signed)
[facility] called, referral number [id number]

## 2022-09-15 NOTE — ED Triage Notes (Signed)
Patient arrives from home, cpr ongoing at this time. Patient initially called for shortness of breath and fire department arrived first and witnessed the arrest. Patient initial rhythm was v-fib. Patient received 5 shocks, 8 epinephrine, and 300mg  amiodarone PTA 20 R ac

## 2022-09-15 NOTE — ED Provider Notes (Signed)
[place] EMERGENCY DEPARTMENT AT [facility] Provider Note  MDM   HPI/ROS:  Harold Brown is a 67 y.o. male with history of hypertension, hyperlipidemia presenting via EMS with CPR after patient arrested at home.  Initial rhythm was V-fib, and patient received a total of 5 shocks.  Patient's rhythm then went to PEA arrest and he was given 8 doses of epinephrine, amiodarone, and rhythm then became asystole just prior to arrival.  Patient received approximately 45 minutes of CPR prior to arrival.  Igel was placed by EMS, and bilateral breath sounds were confirmed.  Pupils noted to be fixed and dilated approximately 15 minutes prior to arrival.  Upon presentation to the emergency department, patient was moved over to the bed and bilateral breath sounds were confirmed.  Pupils fixed and dilated bilaterally.  On initial pulse check, patient remained in asystole and there is no cardiac activity on ultrasound.  1 additional round of CPR was performed and patient was given 1 additional dose of epinephrine.  At next pulse check, patient remained in asystole with no cardiac activity.  Time of death called at 750.    Disposition:  Deceased  Clinical Impression: No diagnosis found.  Rx / DC Orders ED Discharge Orders     None       The plan for this patient was discussed with Dr. Anitra Brown, who voiced agreement and who oversaw evaluation and treatment of this patient.   Clinical Complexity A medically appropriate history, review of systems, and physical exam was performed.  My independent interpretations of EKG, labs, and radiology are documented in the ED course above.   Click here for ABCD2, HEART and other calculatorsREFRESH Note before signing   Patient's presentation is most consistent with acute presentation with potential threat to life or bodily function.  Medical Decision Making Risk Prescription drug management.    HPI/ROS      See MDM section for pertinent  HPI and ROS. A complete ROS was performed with pertinent positives/negatives noted above.   Past Medical History:  Diagnosis Date   Allergy    Anxiety    Arthritis    Depression    GERD (gastroesophageal reflux disease)    HLD (hyperlipidemia)    Hypertension    Sleep apnea    Sleep apnea with use of continuous positive airway pressure (CPAP)    uses CPAP nightly    Past Surgical History:  Procedure Laterality Date   BICEPS TENDON REPAIR Right 11/2015   upper   broken finger Right    Bone fusion from the broken finger   CHONDROPLASTY Right 03/10/2019   Procedure: CHONDROPLASTY;  Surgeon: Tarry Kos, MD;  Location: [facility];  Service: Orthopedics;  Laterality: Right;   KNEE ARTHROSCOPY WITH MEDIAL MENISECTOMY Left 01/01/2019   Procedure: LEFT KNEE ARTHROSCOPY WITH PARTIAL MEDIAL AND LATERAL MENISCECTOMY;  Surgeon: Tarry Kos, MD;  Location: [facility];  Service: Orthopedics;  Laterality: Left;   KNEE ARTHROSCOPY WITH MEDIAL MENISECTOMY Right 03/10/2019   Procedure: RIGHT KNEE ARTHROSCOPY WITH PARTIAL MEDIAL MENISCECTOMY;  Surgeon: Tarry Kos, MD;  Location: [facility];  Service: Orthopedics;  Laterality: Right;   ROTATOR CUFF REPAIR Right 2013, 2017      Physical Exam   Vitals:   Sep 11, 2022 1947 11-Sep-2022 2246  Resp: (!) 22   Weight:  77.1 kg  Height:  6' (1.829 m)    Physical Exam Constitutional:      Appearance: He is  ill-appearing.  Eyes:     Comments: Fixed and dilated bilaterally  Pulmonary:     Comments: Bilateral breath sounds present Neurological:     Comments: GCS 3    Will Harold Incorvaia, MD Department of Emergency Medicine   Please note that this documentation was produced with the assistance of voice-to-text technology and may contain errors.    Dyanne Iha, MD 08/30/22 Charm Rings, MD 09/01/22 2257

## 2022-09-15 NOTE — Code Documentation (Signed)
Patient time of death occurred at 1950

## 2022-09-15 DEATH — deceased

## 2022-10-08 ENCOUNTER — Other Ambulatory Visit: Payer: Self-pay | Admitting: Gastroenterology

## 2022-10-29 ENCOUNTER — Ambulatory Visit: Payer: 59 | Admitting: Family Medicine
